# Patient Record
Sex: Male | Born: 1975 | Race: Black or African American | Hispanic: No | Marital: Single | Smoking: Current every day smoker
Health system: Southern US, Community
[De-identification: ages and names within clinical notes are randomized; demographics above are authoritative.]

## PROBLEM LIST (undated history)

## (undated) DIAGNOSIS — E119 Type 2 diabetes mellitus without complications: Secondary | ICD-10-CM

## (undated) DIAGNOSIS — Z87898 Personal history of other specified conditions: Secondary | ICD-10-CM

## (undated) DIAGNOSIS — F1591 Other stimulant use, unspecified, in remission: Secondary | ICD-10-CM

## (undated) DIAGNOSIS — F419 Anxiety disorder, unspecified: Secondary | ICD-10-CM

## (undated) DIAGNOSIS — F329 Major depressive disorder, single episode, unspecified: Secondary | ICD-10-CM

## (undated) DIAGNOSIS — F429 Obsessive-compulsive disorder, unspecified: Secondary | ICD-10-CM

## (undated) DIAGNOSIS — F32A Depression, unspecified: Secondary | ICD-10-CM

## (undated) DIAGNOSIS — I10 Essential (primary) hypertension: Secondary | ICD-10-CM

## (undated) HISTORY — PX: WISDOM TOOTH EXTRACTION: SHX21

## (undated) HISTORY — PX: TONSILLECTOMY: SUR1361

---

## 1998-09-06 ENCOUNTER — Emergency Department (HOSPITAL_COMMUNITY): Admission: EM | Admit: 1998-09-06 | Discharge: 1998-09-07 | Payer: Self-pay

## 2003-02-28 ENCOUNTER — Emergency Department (HOSPITAL_COMMUNITY): Admission: EM | Admit: 2003-02-28 | Discharge: 2003-02-28 | Payer: Self-pay | Admitting: Emergency Medicine

## 2006-11-01 ENCOUNTER — Emergency Department (HOSPITAL_COMMUNITY): Admission: EM | Admit: 2006-11-01 | Discharge: 2006-11-01 | Payer: Self-pay | Admitting: Emergency Medicine

## 2009-08-17 ENCOUNTER — Emergency Department (HOSPITAL_BASED_OUTPATIENT_CLINIC_OR_DEPARTMENT_OTHER): Admission: EM | Admit: 2009-08-17 | Discharge: 2009-08-17 | Payer: Self-pay | Admitting: Emergency Medicine

## 2010-01-01 ENCOUNTER — Ambulatory Visit: Payer: Self-pay | Admitting: Interventional Radiology

## 2010-01-01 ENCOUNTER — Emergency Department (HOSPITAL_BASED_OUTPATIENT_CLINIC_OR_DEPARTMENT_OTHER): Admission: EM | Admit: 2010-01-01 | Discharge: 2010-01-01 | Payer: Self-pay | Admitting: Emergency Medicine

## 2010-03-18 ENCOUNTER — Ambulatory Visit: Payer: Self-pay | Admitting: Diagnostic Radiology

## 2010-03-18 ENCOUNTER — Emergency Department (HOSPITAL_BASED_OUTPATIENT_CLINIC_OR_DEPARTMENT_OTHER): Admission: EM | Admit: 2010-03-18 | Discharge: 2010-03-18 | Payer: Self-pay | Admitting: Emergency Medicine

## 2010-04-05 ENCOUNTER — Ambulatory Visit: Payer: Self-pay | Admitting: Diagnostic Radiology

## 2010-04-05 ENCOUNTER — Emergency Department (HOSPITAL_BASED_OUTPATIENT_CLINIC_OR_DEPARTMENT_OTHER): Admission: EM | Admit: 2010-04-05 | Discharge: 2010-04-05 | Payer: Self-pay | Admitting: Emergency Medicine

## 2010-04-21 ENCOUNTER — Emergency Department (HOSPITAL_BASED_OUTPATIENT_CLINIC_OR_DEPARTMENT_OTHER): Admission: EM | Admit: 2010-04-21 | Discharge: 2010-04-21 | Payer: Self-pay | Admitting: Emergency Medicine

## 2010-04-21 ENCOUNTER — Ambulatory Visit: Payer: Self-pay | Admitting: Diagnostic Radiology

## 2010-05-10 ENCOUNTER — Emergency Department (HOSPITAL_BASED_OUTPATIENT_CLINIC_OR_DEPARTMENT_OTHER): Admission: EM | Admit: 2010-05-10 | Discharge: 2010-05-10 | Payer: Self-pay | Admitting: Emergency Medicine

## 2010-06-04 ENCOUNTER — Emergency Department (HOSPITAL_BASED_OUTPATIENT_CLINIC_OR_DEPARTMENT_OTHER): Admission: EM | Admit: 2010-06-04 | Discharge: 2010-06-04 | Payer: Self-pay | Admitting: Emergency Medicine

## 2010-06-05 ENCOUNTER — Emergency Department (HOSPITAL_BASED_OUTPATIENT_CLINIC_OR_DEPARTMENT_OTHER): Admission: EM | Admit: 2010-06-05 | Discharge: 2010-06-05 | Payer: Self-pay | Admitting: Emergency Medicine

## 2010-07-07 ENCOUNTER — Emergency Department (HOSPITAL_BASED_OUTPATIENT_CLINIC_OR_DEPARTMENT_OTHER): Admission: EM | Admit: 2010-07-07 | Discharge: 2010-07-07 | Payer: Self-pay | Admitting: Emergency Medicine

## 2010-07-07 ENCOUNTER — Ambulatory Visit: Payer: Self-pay | Admitting: Diagnostic Radiology

## 2010-08-12 ENCOUNTER — Emergency Department (HOSPITAL_BASED_OUTPATIENT_CLINIC_OR_DEPARTMENT_OTHER)
Admission: EM | Admit: 2010-08-12 | Discharge: 2010-08-12 | Payer: Self-pay | Source: Home / Self Care | Admitting: Emergency Medicine

## 2010-08-12 ENCOUNTER — Ambulatory Visit: Payer: Self-pay | Admitting: Diagnostic Radiology

## 2010-09-07 ENCOUNTER — Emergency Department (HOSPITAL_BASED_OUTPATIENT_CLINIC_OR_DEPARTMENT_OTHER)
Admission: EM | Admit: 2010-09-07 | Discharge: 2010-09-08 | Payer: Self-pay | Source: Home / Self Care | Admitting: Emergency Medicine

## 2010-09-08 ENCOUNTER — Ambulatory Visit: Payer: Self-pay | Admitting: Diagnostic Radiology

## 2010-11-04 ENCOUNTER — Emergency Department (HOSPITAL_BASED_OUTPATIENT_CLINIC_OR_DEPARTMENT_OTHER)
Admission: EM | Admit: 2010-11-04 | Discharge: 2010-11-04 | Payer: Self-pay | Source: Home / Self Care | Admitting: Emergency Medicine

## 2010-12-14 ENCOUNTER — Emergency Department (HOSPITAL_BASED_OUTPATIENT_CLINIC_OR_DEPARTMENT_OTHER)
Admission: EM | Admit: 2010-12-14 | Discharge: 2010-12-14 | Payer: Self-pay | Source: Home / Self Care | Admitting: Emergency Medicine

## 2010-12-15 LAB — CBC
Platelets: 242 10*3/uL (ref 150–400)
RBC: 5.32 MIL/uL (ref 4.22–5.81)
WBC: 10.3 10*3/uL (ref 4.0–10.5)

## 2010-12-15 LAB — URINALYSIS, ROUTINE W REFLEX MICROSCOPIC
Nitrite: NEGATIVE
Protein, ur: NEGATIVE mg/dL
Urobilinogen, UA: 0.2 mg/dL (ref 0.0–1.0)

## 2010-12-15 LAB — DIFFERENTIAL
Basophils Relative: 0 % (ref 0–1)
Eosinophils Absolute: 0.3 10*3/uL (ref 0.0–0.7)
Neutrophils Relative %: 57 % (ref 43–77)

## 2010-12-15 LAB — COMPREHENSIVE METABOLIC PANEL
AST: 19 U/L (ref 0–37)
Albumin: 4.1 g/dL (ref 3.5–5.2)
Alkaline Phosphatase: 116 U/L (ref 39–117)
Chloride: 106 mEq/L (ref 96–112)
Creatinine, Ser: 0.8 mg/dL (ref 0.4–1.5)
GFR calc Af Amer: >60 mL/min (ref >60)
Potassium: 4.3 mEq/L (ref 3.5–5.1)
Total Bilirubin: 0.5 mg/dL (ref 0.3–1.2)
Total Protein: 7.8 g/dL (ref 6.0–8.3)

## 2011-02-03 LAB — LIPASE, BLOOD: Lipase: 52 U/L (ref 23–300)

## 2011-02-03 LAB — URINALYSIS, ROUTINE W REFLEX MICROSCOPIC
Nitrite: NEGATIVE
Protein, ur: NEGATIVE mg/dL
Specific Gravity, Urine: 1.028 (ref 1.005–1.030)
Urobilinogen, UA: 1 mg/dL (ref 0.0–1.0)

## 2011-02-03 LAB — COMPREHENSIVE METABOLIC PANEL
Albumin: 4.1 g/dL (ref 3.5–5.2)
Alkaline Phosphatase: 117 U/L (ref 39–117)
BUN: 11 mg/dL (ref 6–23)
CO2: 27 mEq/L (ref 19–32)
Chloride: 107 mEq/L (ref 96–112)
Creatinine, Ser: 1 mg/dL (ref 0.4–1.5)
GFR calc non Af Amer: >60 mL/min (ref >60)
Glucose, Bld: 80 mg/dL (ref 70–99)
Total Bilirubin: 0.5 mg/dL (ref 0.3–1.2)

## 2011-02-03 LAB — CBC
HCT: 41.3 % (ref 39.0–52.0)
MCH: 28.6 pg (ref 26.0–34.0)
MCV: 84.7 fL (ref 78.0–100.0)
Platelets: 205 10*3/uL (ref 150–400)
RBC: 4.88 MIL/uL (ref 4.22–5.81)

## 2011-02-03 LAB — DIFFERENTIAL
Basophils Absolute: 0.2 10*3/uL — ABNORMAL HIGH (ref 0.0–0.1)
Basophils Relative: 2 % — ABNORMAL HIGH (ref 0–1)
Lymphocytes Relative: 31 % (ref 12–46)
Neutro Abs: 4 10*3/uL (ref 1.7–7.7)
Neutrophils Relative %: 55 % (ref 43–77)

## 2011-02-04 LAB — BASIC METABOLIC PANEL
Calcium: 9.1 mg/dL (ref 8.4–10.5)
Creatinine, Ser: 0.9 mg/dL (ref 0.4–1.5)
GFR calc Af Amer: >60 mL/min (ref >60)
GFR calc non Af Amer: >60 mL/min (ref >60)
Sodium: 142 mEq/L (ref 135–145)

## 2011-02-04 LAB — DIFFERENTIAL
Basophils Relative: 2 % — ABNORMAL HIGH (ref 0–1)
Eosinophils Absolute: 0.4 10*3/uL (ref 0.0–0.7)
Lymphs Abs: 2.7 10*3/uL (ref 0.7–4.0)
Monocytes Relative: 11 % (ref 3–12)
Neutro Abs: 2.6 10*3/uL (ref 1.7–7.7)
Neutrophils Relative %: 40 % — ABNORMAL LOW (ref 43–77)

## 2011-02-04 LAB — CBC
Hemoglobin: 14.3 g/dL (ref 13.0–17.0)
MCH: 28.3 pg (ref 26.0–34.0)
MCHC: 32.6 g/dL (ref 30.0–36.0)
Platelets: 237 10*3/uL (ref 150–400)
RBC: 5.06 MIL/uL (ref 4.22–5.81)

## 2011-02-04 LAB — POCT CARDIAC MARKERS: Myoglobin, poc: 51.1 ng/mL (ref 12–200)

## 2011-02-04 LAB — D-DIMER, QUANTITATIVE: D-Dimer, Quant: 0.39 ug/mL-FEU (ref 0.00–0.48)

## 2011-02-05 LAB — BASIC METABOLIC PANEL WITH GFR
GFR calc non Af Amer: >60 mL/min (ref >60)
Glucose, Bld: 102 mg/dL — ABNORMAL HIGH (ref 70–99)
Potassium: 4.4 meq/L (ref 3.5–5.1)
Sodium: 143 meq/L (ref 135–145)

## 2011-02-05 LAB — BASIC METABOLIC PANEL
BUN: 12 mg/dL (ref 6–23)
CO2: 29 mEq/L (ref 19–32)
Calcium: 9.1 mg/dL (ref 8.4–10.5)
Chloride: 104 mEq/L (ref 96–112)
Creatinine, Ser: 1.1 mg/dL (ref 0.4–1.5)
GFR calc Af Amer: >60 mL/min (ref >60)

## 2011-02-06 LAB — URINALYSIS, ROUTINE W REFLEX MICROSCOPIC
Bilirubin Urine: NEGATIVE
Hgb urine dipstick: NEGATIVE
Nitrite: NEGATIVE
Specific Gravity, Urine: 1.024 (ref 1.005–1.030)
pH: 6 (ref 5.0–8.0)

## 2011-02-06 LAB — CBC
MCHC: 32.9 g/dL (ref 30.0–36.0)
MCV: 85 fL (ref 78.0–100.0)
Platelets: 262 10*3/uL (ref 150–400)
WBC: 6.7 10*3/uL (ref 4.0–10.5)

## 2011-02-06 LAB — DIFFERENTIAL
Eosinophils Relative: 4 % (ref 0–5)
Lymphocytes Relative: 26 % (ref 12–46)
Lymphs Abs: 1.7 10*3/uL (ref 0.7–4.0)
Monocytes Absolute: 0.5 10*3/uL (ref 0.1–1.0)
Monocytes Relative: 8 % (ref 3–12)

## 2011-02-06 LAB — COMPREHENSIVE METABOLIC PANEL
AST: 18 U/L (ref 0–37)
Albumin: 4 g/dL (ref 3.5–5.2)
Calcium: 9.1 mg/dL (ref 8.4–10.5)
Creatinine, Ser: 0.9 mg/dL (ref 0.4–1.5)
GFR calc Af Amer: >60 mL/min (ref >60)

## 2011-02-07 LAB — URINALYSIS, ROUTINE W REFLEX MICROSCOPIC
Glucose, UA: NEGATIVE mg/dL
Ketones, ur: NEGATIVE mg/dL
Nitrite: NEGATIVE
Nitrite: NEGATIVE
Protein, ur: NEGATIVE mg/dL
Urobilinogen, UA: 0.2 mg/dL (ref 0.0–1.0)
pH: 6.5 (ref 5.0–8.0)
pH: 6 (ref 5.0–8.0)

## 2011-02-07 LAB — DIFFERENTIAL
Basophils Relative: 3 % — ABNORMAL HIGH (ref 0–1)
Basophils Relative: 2 % — ABNORMAL HIGH (ref 0–1)
Lymphocytes Relative: 28 % (ref 12–46)
Lymphs Abs: 2.3 10*3/uL (ref 0.7–4.0)
Monocytes Absolute: 0.5 10*3/uL (ref 0.1–1.0)
Monocytes Relative: 9 % (ref 3–12)
Monocytes Relative: 11 % (ref 3–12)
Neutro Abs: 3.6 10*3/uL (ref 1.7–7.7)
Neutro Abs: 4.1 10*3/uL (ref 1.7–7.7)
Neutrophils Relative %: 53 % (ref 43–77)

## 2011-02-07 LAB — CBC
Hemoglobin: 14.2 g/dL (ref 13.0–17.0)
MCHC: 32 g/dL (ref 30.0–36.0)
MCHC: 33.3 g/dL (ref 30.0–36.0)
MCV: 84 fL (ref 78.0–100.0)
RBC: 5.27 MIL/uL (ref 4.22–5.81)
RBC: 5.61 MIL/uL (ref 4.22–5.81)
WBC: 8 10*3/uL (ref 4.0–10.5)

## 2011-02-07 LAB — BASIC METABOLIC PANEL
Calcium: 9.5 mg/dL (ref 8.4–10.5)
Creatinine, Ser: 0.9 mg/dL (ref 0.4–1.5)
GFR calc Af Amer: >60 mL/min (ref >60)
GFR calc non Af Amer: >60 mL/min (ref >60)
Sodium: 142 mEq/L (ref 135–145)

## 2011-02-07 LAB — POCT I-STAT, CHEM 8
Calcium, Ion: 1.14 mmol/L (ref 1.12–1.32)
Chloride: 105 mEq/L (ref 96–112)
Creatinine, Ser: 0.9 mg/dL (ref 0.4–1.5)
Glucose, Bld: 126 mg/dL — ABNORMAL HIGH (ref 70–99)
HCT: 46 % (ref 39.0–52.0)

## 2011-02-07 LAB — RAPID STREP SCREEN (MED CTR MEBANE ONLY): Streptococcus, Group A Screen (Direct): NEGATIVE

## 2011-02-08 LAB — BASIC METABOLIC PANEL
BUN: 9 mg/dL (ref 6–23)
Creatinine, Ser: 0.8 mg/dL (ref 0.4–1.5)
GFR calc Af Amer: >60 mL/min (ref >60)
GFR calc non Af Amer: >60 mL/min (ref >60)
Potassium: 4 mEq/L (ref 3.5–5.1)

## 2011-02-08 LAB — POCT CARDIAC MARKERS
Myoglobin, poc: 50.1 ng/mL (ref 12–200)
Troponin i, poc: <0.05 ng/mL (ref 0.00–0.09)

## 2011-02-08 LAB — DIFFERENTIAL
Lymphocytes Relative: 36 % (ref 12–46)
Lymphs Abs: 2.9 10*3/uL (ref 0.7–4.0)
Neutrophils Relative %: 47 % (ref 43–77)

## 2011-02-08 LAB — CBC
HCT: 42.9 % (ref 39.0–52.0)
Platelets: 244 10*3/uL (ref 150–400)
WBC: 8.1 10*3/uL (ref 4.0–10.5)

## 2011-02-09 LAB — POCT CARDIAC MARKERS
Troponin i, poc: <0.05 ng/mL (ref 0.00–0.09)
Troponin i, poc: <0.05 ng/mL (ref 0.00–0.09)

## 2011-02-09 LAB — BASIC METABOLIC PANEL
CO2: 29 mEq/L (ref 19–32)
Calcium: 9.2 mg/dL (ref 8.4–10.5)
Creatinine, Ser: 0.8 mg/dL (ref 0.4–1.5)
Glucose, Bld: 96 mg/dL (ref 70–99)

## 2011-02-09 LAB — GLUCOSE, CAPILLARY: Glucose-Capillary: 110 mg/dL — ABNORMAL HIGH (ref 70–99)

## 2011-02-09 LAB — URINALYSIS, ROUTINE W REFLEX MICROSCOPIC
Glucose, UA: NEGATIVE mg/dL
Hgb urine dipstick: NEGATIVE
Ketones, ur: NEGATIVE mg/dL
Protein, ur: NEGATIVE mg/dL

## 2011-02-22 ENCOUNTER — Emergency Department (HOSPITAL_BASED_OUTPATIENT_CLINIC_OR_DEPARTMENT_OTHER)
Admission: EM | Admit: 2011-02-22 | Discharge: 2011-02-22 | Disposition: A | Discharge status: Home / Self Care | Payer: 59 | Attending: Emergency Medicine | Admitting: Emergency Medicine

## 2011-02-22 ENCOUNTER — Emergency Department (INDEPENDENT_AMBULATORY_CARE_PROVIDER_SITE_OTHER): Payer: 59

## 2011-02-22 DIAGNOSIS — R071 Chest pain on breathing: Secondary | ICD-10-CM

## 2011-02-22 DIAGNOSIS — I10 Essential (primary) hypertension: Secondary | ICD-10-CM | POA: Insufficient documentation

## 2011-02-22 DIAGNOSIS — R0789 Other chest pain: Secondary | ICD-10-CM | POA: Insufficient documentation

## 2011-02-22 DIAGNOSIS — K219 Gastro-esophageal reflux disease without esophagitis: Secondary | ICD-10-CM | POA: Insufficient documentation

## 2011-02-22 DIAGNOSIS — M549 Dorsalgia, unspecified: Secondary | ICD-10-CM

## 2011-02-22 DIAGNOSIS — R51 Headache: Secondary | ICD-10-CM | POA: Insufficient documentation

## 2011-02-22 DIAGNOSIS — Z87891 Personal history of nicotine dependence: Secondary | ICD-10-CM

## 2011-02-22 DIAGNOSIS — J329 Chronic sinusitis, unspecified: Secondary | ICD-10-CM | POA: Insufficient documentation

## 2011-02-22 DIAGNOSIS — Z79899 Other long term (current) drug therapy: Secondary | ICD-10-CM | POA: Insufficient documentation

## 2011-02-22 LAB — CBC
HCT: 43.6 % (ref 39.0–52.0)
Hemoglobin: 14.7 g/dL (ref 13.0–17.0)
MCH: 27.9 pg (ref 26.0–34.0)
MCHC: 33.7 g/dL (ref 30.0–36.0)
RDW: 14 % (ref 11.5–15.5)

## 2011-02-22 LAB — BASIC METABOLIC PANEL
CO2: 24 mEq/L (ref 19–32)
Calcium: 9.3 mg/dL (ref 8.4–10.5)
Creatinine, Ser: 0.8 mg/dL (ref 0.4–1.5)
GFR calc Af Amer: >60 mL/min (ref >60)
GFR calc non Af Amer: >60 mL/min (ref >60)
Glucose, Bld: 105 mg/dL — ABNORMAL HIGH (ref 70–99)

## 2011-02-22 LAB — DIFFERENTIAL
Basophils Absolute: 0 10*3/uL (ref 0.0–0.1)
Basophils Relative: 1 % (ref 0–1)
Eosinophils Relative: 8 % — ABNORMAL HIGH (ref 0–5)
Monocytes Absolute: 0.7 10*3/uL (ref 0.1–1.0)
Monocytes Relative: 13 % — ABNORMAL HIGH (ref 3–12)
Neutro Abs: 2.2 10*3/uL (ref 1.7–7.7)

## 2011-02-25 LAB — BASIC METABOLIC PANEL
BUN: 11 mg/dL (ref 6–23)
CO2: 23 mEq/L (ref 19–32)
Chloride: 104 mEq/L (ref 96–112)
Glucose, Bld: 118 mg/dL — ABNORMAL HIGH (ref 70–99)
Potassium: 3.6 mEq/L (ref 3.5–5.1)
Sodium: 141 mEq/L (ref 135–145)

## 2011-02-25 LAB — CBC
HCT: 50.1 % (ref 39.0–52.0)
Hemoglobin: 16.6 g/dL (ref 13.0–17.0)
MCHC: 33.1 g/dL (ref 30.0–36.0)
MCV: 84.7 fL (ref 78.0–100.0)
Platelets: 295 10*3/uL (ref 150–400)
RDW: 13.1 % (ref 11.5–15.5)

## 2011-03-17 ENCOUNTER — Emergency Department (HOSPITAL_BASED_OUTPATIENT_CLINIC_OR_DEPARTMENT_OTHER)
Admission: EM | Admit: 2011-03-17 | Discharge: 2011-03-17 | Disposition: A | Discharge status: Home / Self Care | Payer: 59 | Attending: Emergency Medicine | Admitting: Emergency Medicine

## 2011-03-17 DIAGNOSIS — F17200 Nicotine dependence, unspecified, uncomplicated: Secondary | ICD-10-CM | POA: Insufficient documentation

## 2011-03-17 DIAGNOSIS — I10 Essential (primary) hypertension: Secondary | ICD-10-CM | POA: Insufficient documentation

## 2011-03-17 DIAGNOSIS — R197 Diarrhea, unspecified: Secondary | ICD-10-CM | POA: Insufficient documentation

## 2011-03-17 DIAGNOSIS — R110 Nausea: Secondary | ICD-10-CM | POA: Insufficient documentation

## 2011-03-17 DIAGNOSIS — K219 Gastro-esophageal reflux disease without esophagitis: Secondary | ICD-10-CM | POA: Insufficient documentation

## 2011-03-17 LAB — URINALYSIS, ROUTINE W REFLEX MICROSCOPIC
Bilirubin Urine: NEGATIVE
Hgb urine dipstick: NEGATIVE
Ketones, ur: NEGATIVE mg/dL
Protein, ur: NEGATIVE mg/dL
Specific Gravity, Urine: 1.019 (ref 1.005–1.030)
Urobilinogen, UA: 0.2 mg/dL (ref 0.0–1.0)

## 2011-03-17 LAB — BASIC METABOLIC PANEL
BUN: 10 mg/dL (ref 6–23)
CO2: 28 mEq/L (ref 19–32)
Calcium: 9.3 mg/dL (ref 8.4–10.5)
Glucose, Bld: 88 mg/dL (ref 70–99)
Sodium: 143 mEq/L (ref 135–145)

## 2011-10-08 ENCOUNTER — Emergency Department (HOSPITAL_BASED_OUTPATIENT_CLINIC_OR_DEPARTMENT_OTHER)
Admission: EM | Admit: 2011-10-08 | Discharge: 2011-10-08 | Disposition: A | Discharge status: Home / Self Care | Payer: 59 | Attending: Emergency Medicine | Admitting: Emergency Medicine

## 2011-10-08 ENCOUNTER — Encounter: Payer: Self-pay | Admitting: *Deleted

## 2011-10-08 DIAGNOSIS — K5900 Constipation, unspecified: Secondary | ICD-10-CM | POA: Insufficient documentation

## 2011-10-08 DIAGNOSIS — K644 Residual hemorrhoidal skin tags: Secondary | ICD-10-CM | POA: Insufficient documentation

## 2011-10-08 DIAGNOSIS — I10 Essential (primary) hypertension: Secondary | ICD-10-CM | POA: Insufficient documentation

## 2011-10-08 HISTORY — DX: Essential (primary) hypertension: I10

## 2011-10-08 MED ORDER — HYDROCORTISONE 2.5 % RE CREA: TOPICAL_CREAM | RECTAL | Status: AC

## 2011-10-08 NOTE — ED Notes (Signed)
Pt states he noticed 2 swollen areas around his rectum this a.m. +itching No BM in 3 days

## 2011-10-08 NOTE — ED Provider Notes (Signed)
Medical screening examination/treatment/procedure(s) were performed by non-physician practitioner and as supervising physician I was immediately available for consultation/collaboration.    Celene Kras, MD 10/08/11 [phone]

## 2011-10-08 NOTE — ED Provider Notes (Signed)
History     CSN: [id number] Arrival date & time: 10/08/2011  2:06 PM   First MD Initiated Contact with Patient 10/08/11 1415      Chief Complaint  Patient presents with  . Hemorrhoids    (Consider location/radiation/quality/duration/timing/severity/associated sxs/prior treatment) Patient is a 35 y.o. male presenting with constipation. The history is provided by the patient.  Constipation  The current episode started 3 to 5 days ago. The problem has been unchanged. Pain severity now: He complains of generalized, mild episodic abdominal pain associated with constipation. He also complains of rectal pain and swelling without bleeding. The stool is described as hard. Associated symptoms include abdominal pain, hemorrhoids and rectal pain. Pertinent negatives include no fever.    Past Medical History  Diagnosis Date  . Hypertension     Past Surgical History  Procedure Date  . Wisdom tooth extraction   . Tonsillectomy     History reviewed. No pertinent family history.  History  Substance Use Topics  . Smoking status: Never Smoker   . Smokeless tobacco: Not on file  . Alcohol Use: No      Review of Systems  Constitutional: Negative for fever and chills.  Gastrointestinal: Positive for abdominal pain, constipation, rectal pain and hemorrhoids.       See HPI.  Musculoskeletal: Negative.   Skin: Negative.   Neurological: Negative.     Allergies  Penicillins  Home Medications   Current Outpatient Rx  Name Route Sig Dispense Refill  . ALPRAZOLAM ER 0.5 MG PO TB24 Oral Take 0.5 mg by mouth 1 day or 1 dose.      . IMIPRAMINE HCL 10 MG PO TABS Oral Take 20 mg by mouth at bedtime.      . NEBIVOLOL HCL 10 MG PO TABS Oral Take 10 mg by mouth daily.        BP 137/83  Pulse 84  Temp(Src) 98.4 F (36.9 C) (Oral)  Resp 20  Ht 5\' 11"  (1.803 m)  Wt 255 lb (115.667 kg)  BMI 35.57 kg/m2  SpO2 99%  Physical Exam  Constitutional: He is oriented to person, place, and  time. He appears well-developed and well-nourished.  Neck: Normal range of motion.  Pulmonary/Chest: Effort normal.  Abdominal: Soft. There is no tenderness. There is no rebound and no guarding.  Genitourinary: Rectal exam shows external hemorrhoid and tenderness. Rectal exam shows no fissure.  Musculoskeletal: Normal range of motion.  Neurological: He is alert and oriented to person, place, and time.  Skin: Skin is warm and dry.  Psychiatric: He has a normal mood and affect.    ED Course  Procedures (including critical care time)  Labs Reviewed - No data to display No results found.   No diagnosis found.    MDM          Rodena Medin, PA 10/08/11 1501

## 2012-04-09 ENCOUNTER — Encounter (HOSPITAL_BASED_OUTPATIENT_CLINIC_OR_DEPARTMENT_OTHER): Payer: Self-pay | Admitting: *Deleted

## 2012-04-09 ENCOUNTER — Emergency Department (HOSPITAL_BASED_OUTPATIENT_CLINIC_OR_DEPARTMENT_OTHER)
Admission: EM | Admit: 2012-04-09 | Discharge: 2012-04-09 | Disposition: A | Discharge status: Home / Self Care | Payer: Self-pay | Attending: Emergency Medicine | Admitting: Emergency Medicine

## 2012-04-09 DIAGNOSIS — R509 Fever, unspecified: Secondary | ICD-10-CM | POA: Insufficient documentation

## 2012-04-09 DIAGNOSIS — R059 Cough, unspecified: Secondary | ICD-10-CM | POA: Insufficient documentation

## 2012-04-09 DIAGNOSIS — J019 Acute sinusitis, unspecified: Secondary | ICD-10-CM | POA: Insufficient documentation

## 2012-04-09 DIAGNOSIS — R51 Headache: Secondary | ICD-10-CM | POA: Insufficient documentation

## 2012-04-09 DIAGNOSIS — R05 Cough: Secondary | ICD-10-CM | POA: Insufficient documentation

## 2012-04-09 HISTORY — DX: Anxiety disorder, unspecified: F41.9

## 2012-04-09 MED ORDER — CLINDAMYCIN HCL 150 MG PO CAPS: 300.0000 mg | ORAL_CAPSULE | Freq: Three times a day (TID) | ORAL | Status: AC

## 2012-04-09 MED ORDER — PREDNISONE (PAK) 10 MG PO TABS: ORAL_TABLET | ORAL | Status: AC

## 2012-04-09 NOTE — Discharge Instructions (Signed)
How to clear your sinuses:  Use Afrin (and over-the-counter nasal decongestant spray): 1-2 squirts in each nostril. Wait 10 minutes (while using either warm compresses to face, a humidifier, or taking a hot shower) Then do 2 squirts of fluticasone/flonase (prescription)  in each nostril. Do this for 1-2 weeks and that should help significantly with sinus congestion.   Sinusitis Sinuses are air pockets within the bones of your face. The growth of bacteria within a sinus leads to infection. The infection prevents the sinuses from draining. This infection is called sinusitis. SYMPTOMS  There will be different areas of pain depending on which sinuses have become infected.  The maxillary sinuses often produce pain beneath the eyes.   Frontal sinusitis may cause pain in the middle of the forehead and above the eyes.  Other problems (symptoms) include:  Toothaches.   Colored, pus-like (purulent) drainage from the nose.   Swelling, warmth, and tenderness over the sinus areas may be signs of infection.  TREATMENT  Sinusitis is most often determined by an exam.X-rays may be taken. If x-rays have been taken, make sure you obtain your results or find out how you are to obtain them. Your caregiver may give you medications (antibiotics). These are medications that will help kill the bacteria causing the infection. You may also be given a medication (decongestant) that helps to reduce sinus swelling.  HOME CARE INSTRUCTIONS   Only take over-the-counter or prescription medicines for pain, discomfort, or fever as directed by your caregiver.   Drink extra fluids. Fluids help thin the mucus so your sinuses can drain more easily.   Applying either moist heat or ice packs to the sinus areas may help relieve discomfort.   Use saline nasal sprays to help moisten your sinuses. The sprays can be found at your local drugstore.  SEEK IMMEDIATE MEDICAL CARE IF:  You have a fever.   You have increasing pain,  severe headaches, or toothache.   You have nausea, vomiting, or drowsiness.   You develop unusual swelling around the face or trouble seeing.  MAKE SURE YOU:   Understand these instructions.   Will watch your condition.   Will get help right away if you are not doing well or get worse.  Document Released: 11/07/2005 Document Revised: 10/27/2011 Document Reviewed: 06/06/2007 [facility] Patient Information 2012 [place], [place].

## 2012-04-09 NOTE — ED Notes (Signed)
Pt c/o URI symptoms x 2 days, fever and sore throat with sinus congestion

## 2012-04-09 NOTE — ED Provider Notes (Signed)
History     CSN: [id number]  Arrival date & time 04/09/12  1525   First MD Initiated Contact with Patient 04/09/12 1545     4:16 PM Patient reports sinus infection for 2 weeks. Reports worsening symptoms in the past 2 days. Reports symptoms include nasal congestion, rhinorrhea, sinus pressure, sinus headache, fever, cough. Reports fever of 101 this morning relieved after taking 500 mg Tylenol. States he has had similar symptoms on 2 other occasions earlier this year. Reports he was given a Z-Pak. States this medication is ineffective for him. Denies chest pain, shortness of breath, neck pain, difficulty swallowing, abdominal pain, nausea, vomiting.  Patient is a 36 y.o. male presenting with URI.  URI The primary symptoms include fever, headaches, sore throat and cough. Primary symptoms do not include ear pain, wheezing, abdominal pain, nausea, vomiting, myalgias, arthralgias or rash. The current episode started more than 1 week ago. This is a new problem. The problem has not changed since onset. Symptoms associated with the illness include chills, facial pain, sinus pressure, congestion and rhinorrhea. The following treatments were addressed: Acetaminophen was ineffective. A decongestant was ineffective. NSAIDs were ineffective.    Past Medical History  Diagnosis Date  . Hypertension   . Anxiety     Past Surgical History  Procedure Date  . Wisdom tooth extraction   . Tonsillectomy     History reviewed. No pertinent family history.  History  Substance Use Topics  . Smoking status: Current Everyday Smoker -- 0.5 packs/day  . Smokeless tobacco: Not on file  . Alcohol Use: No      Review of Systems  Constitutional: Positive for fever and chills.  HENT: Positive for congestion, sore throat, rhinorrhea, postnasal drip and sinus pressure. Negative for ear pain.   Respiratory: Positive for cough. Negative for shortness of breath and wheezing.   Gastrointestinal: Negative for  nausea, vomiting and abdominal pain.  Musculoskeletal: Negative for myalgias and arthralgias.  Skin: Negative for rash.  Neurological: Positive for headaches.  All other systems reviewed and are negative.    Allergies  Penicillins  Home Medications   Current Outpatient Rx  Name Route Sig Dispense Refill  . ALPRAZOLAM ER 0.5 MG PO TB24 Oral Take 0.5 mg by mouth 1 day or 1 dose.      . IMIPRAMINE HCL 10 MG PO TABS Oral Take 20 mg by mouth at bedtime.      . NEBIVOLOL HCL 10 MG PO TABS Oral Take 10 mg by mouth daily.        BP 166/67  Pulse 95  Temp(Src) 99 F (37.2 C) (Oral)  Resp 18  Ht 5\' 11"  (1.803 m)  Wt 260 lb (117.935 kg)  BMI 36.26 kg/m2  SpO2 100%  Physical Exam  Constitutional: He is oriented to person, place, and time. He appears well-developed and well-nourished.  HENT:  Head: Normocephalic and atraumatic. No trismus in the jaw.  Right Ear: Tympanic membrane, external ear and ear canal normal.  Left Ear: Tympanic membrane, external ear and ear canal normal.  Nose: Mucosal edema and rhinorrhea present. No nose lacerations, sinus tenderness, nasal deformity, septal deviation or nasal septal hematoma. No epistaxis. Right sinus exhibits maxillary sinus tenderness. Right sinus exhibits no frontal sinus tenderness. Left sinus exhibits maxillary sinus tenderness. Left sinus exhibits no frontal sinus tenderness.  Mouth/Throat: Oropharynx is clear and moist and mucous membranes are normal. No uvula swelling. No oropharyngeal exudate, posterior oropharyngeal edema, posterior oropharyngeal erythema or tonsillar abscesses.  Eyes:  Conjunctivae are normal. Pupils are equal, round, and reactive to light.  Neck: Normal range of motion. Neck supple. No spinous process tenderness and no muscular tenderness present. No rigidity.  Cardiovascular: Normal rate, regular rhythm and normal heart sounds.  Exam reveals no gallop and no friction rub.   No murmur heard. Pulmonary/Chest: Effort  normal and breath sounds normal. He has no wheezes. He has no rales. He exhibits no tenderness.  Abdominal: Soft. Bowel sounds are normal.  Lymphadenopathy:    He has no cervical adenopathy.  Neurological: He is alert and oriented to person, place, and time.  Skin: Skin is warm and dry. No rash noted. No erythema. No pallor.  Psychiatric: He has a normal mood and affect. His behavior is normal.    ED Course  Procedures   MDM  Patient has green nasal drainage, fevers for greater and sinus induction for greater than 2 weeks will treat as a acute sinusitis with antibiotics and prednisone pack. Advised patient  however that the majority of sinusitis is viral and antibiotics are not doing symptoms he may just have to wait patiently for symptoms to improve. Will also give him instructions for clearing sinus drainage. Patient reports he can have a followup limit with [facility] weeks. Advised him to go ahead and schedule this. Patient voices understanding and is ready for discharge  Thomasene Lot, Cordelia Poche 04/09/12 1623

## 2012-04-12 NOTE — ED Provider Notes (Signed)
Medical screening examination/treatment/procedure(s) were performed by non-physician practitioner and as supervising physician I was immediately available for consultation/collaboration.    Nelia Shi, MD 04/12/12 [phone]

## 2013-01-09 ENCOUNTER — Encounter (HOSPITAL_BASED_OUTPATIENT_CLINIC_OR_DEPARTMENT_OTHER): Payer: Self-pay | Admitting: *Deleted

## 2013-01-09 DIAGNOSIS — R197 Diarrhea, unspecified: Secondary | ICD-10-CM | POA: Insufficient documentation

## 2013-01-09 DIAGNOSIS — K625 Hemorrhage of anus and rectum: Secondary | ICD-10-CM | POA: Insufficient documentation

## 2013-01-09 DIAGNOSIS — K648 Other hemorrhoids: Secondary | ICD-10-CM | POA: Insufficient documentation

## 2013-01-09 DIAGNOSIS — R110 Nausea: Secondary | ICD-10-CM | POA: Insufficient documentation

## 2013-01-09 DIAGNOSIS — K921 Melena: Secondary | ICD-10-CM | POA: Insufficient documentation

## 2013-01-09 DIAGNOSIS — I10 Essential (primary) hypertension: Secondary | ICD-10-CM | POA: Insufficient documentation

## 2013-01-09 DIAGNOSIS — F411 Generalized anxiety disorder: Secondary | ICD-10-CM | POA: Insufficient documentation

## 2013-01-09 DIAGNOSIS — F17200 Nicotine dependence, unspecified, uncomplicated: Secondary | ICD-10-CM | POA: Insufficient documentation

## 2013-01-09 DIAGNOSIS — Z79899 Other long term (current) drug therapy: Secondary | ICD-10-CM | POA: Insufficient documentation

## 2013-01-09 NOTE — ED Notes (Signed)
Pt c/o n/d with rectal bleeding x 1 day

## 2013-01-10 ENCOUNTER — Emergency Department (HOSPITAL_BASED_OUTPATIENT_CLINIC_OR_DEPARTMENT_OTHER)
Admission: EM | Admit: 2013-01-10 | Discharge: 2013-01-10 | Disposition: A | Discharge status: Home / Self Care | Payer: Self-pay | Attending: Emergency Medicine | Admitting: Emergency Medicine

## 2013-01-10 DIAGNOSIS — K921 Melena: Secondary | ICD-10-CM

## 2013-01-10 DIAGNOSIS — R110 Nausea: Secondary | ICD-10-CM

## 2013-01-10 DIAGNOSIS — R197 Diarrhea, unspecified: Secondary | ICD-10-CM

## 2013-01-10 DIAGNOSIS — K648 Other hemorrhoids: Secondary | ICD-10-CM

## 2013-01-10 LAB — COMPREHENSIVE METABOLIC PANEL
Albumin: 3.6 g/dL (ref 3.5–5.2)
BUN: 10 mg/dL (ref 6–23)
Calcium: 8.9 mg/dL (ref 8.4–10.5)
Chloride: 103 mEq/L (ref 96–112)
Creatinine, Ser: 1 mg/dL (ref 0.50–1.35)
GFR calc non Af Amer: >90 mL/min (ref >90)
Total Bilirubin: 0.3 mg/dL (ref 0.3–1.2)

## 2013-01-10 LAB — OCCULT BLOOD X 1 CARD TO LAB, STOOL: Fecal Occult Bld: NEGATIVE

## 2013-01-10 LAB — LIPASE, BLOOD: Lipase: 18 U/L (ref 11–59)

## 2013-01-10 MED ORDER — LOPERAMIDE HCL 2 MG PO CAPS
2.0000 mg | ORAL_CAPSULE | Freq: Four times a day (QID) | ORAL | Status: DC | PRN
Start: 2013-01-10 — End: 2013-03-04

## 2013-01-10 MED ORDER — ONDANSETRON 8 MG PO TBDP: 8.0000 mg | ORAL_TABLET | Freq: Once | ORAL | Status: DC

## 2013-01-10 MED ORDER — ONDANSETRON HCL 4 MG/2ML IJ SOLN
INTRAMUSCULAR | Status: AC
  Administered 2013-01-10: 8 mg
  Filled 2013-01-10: qty 4

## 2013-01-10 MED ORDER — LOPERAMIDE HCL 2 MG PO CAPS
4.0000 mg | ORAL_CAPSULE | Freq: Once | ORAL | Status: AC
  Administered 2013-01-10: 4 mg via ORAL
  Filled 2013-01-10: qty 2

## 2013-01-10 MED ORDER — KETOROLAC TROMETHAMINE 15 MG/ML IJ SOLN
15.0000 mg | Freq: Once | INTRAMUSCULAR | Status: AC
  Administered 2013-01-10: 15 mg via INTRAVENOUS
  Filled 2013-01-10: qty 1

## 2013-01-10 MED ORDER — SODIUM CHLORIDE 0.9 % IV BOLUS (SEPSIS)
1000.0000 mL | Freq: Once | INTRAVENOUS | Status: AC
  Administered 2013-01-10: 1000 mL via INTRAVENOUS

## 2013-01-10 MED ORDER — ONDANSETRON HCL 4 MG PO TABS: 4.0000 mg | ORAL_TABLET | Freq: Four times a day (QID) | ORAL | Status: DC

## 2013-01-10 MED ORDER — ONDANSETRON 8 MG/NS 50 ML IVPB
8.0000 mg | Freq: Once | INTRAVENOUS | Status: AC
  Filled 2013-01-10: qty 8

## 2013-01-10 NOTE — ED Provider Notes (Signed)
History     CSN: [id number]  Arrival date & time 01/09/13  2325   First MD Initiated Contact with Patient 01/10/13 0147      Chief Complaint  Patient presents with  . Rectal Bleeding  . Nausea  . Diarrhea    (Consider location/radiation/quality/duration/timing/severity/associated sxs/prior treatment) HPI Dean Hunt is a 37 y.o. male who presents with nausea and diarrhea. Patient's symptoms started about 5 days ago over the course of the last 3 days he's had 10-12 episodes of diarrhea daily associated with nausea. Patient had a fever of 102 yesterday which resolved with Tylenol and ibuprofen overnight. No fevers today. Patient's been using Zofran and Phenergan for nausea with limited success. Patient also says he's got abdominal pain at the top and the bottom of his abdomen, this is moderate to severe, feels like knots. Patient says he last ate out on Valentine's Day at [employer] and had a salad. No one else is sick at home. No recent travel. No chest pain, shortness of breath, rash.  Patient has had some myalgias with his fever. Patient says he seen some blood in the toilet today and some rectal bleeding with blood on the toilet paper. Patient says he's had a history of hemorrhoids in the past about ago.   Past Medical History  Diagnosis Date  . Hypertension   . Anxiety     Past Surgical History  Procedure Laterality Date  . Wisdom tooth extraction    . Tonsillectomy      History reviewed. No pertinent family history.  History  Substance Use Topics  . Smoking status: Current Every Day Smoker -- 0.50 packs/day    Types: Cigarettes  . Smokeless tobacco: Not on file  . Alcohol Use: No      Review of Systems At least 10pt or greater review of systems completed and are negative except where specified in the HPI.  Allergies  Compazine and Penicillins  Home Medications   Current Outpatient Rx  Name  Route  Sig  Dispense  Refill  . acetaminophen (TYLENOL) 500  MG tablet   Oral   Take 500 mg by mouth every 6 (six) hours as needed. Patient used this medication for his fever.         . ALPRAZolam (XANAX XR) 0.5 MG 24 hr tablet   Oral   Take 0.5 mg by mouth 1 day or 1 dose.           Marland Kitchen imipramine (TOFRANIL) 10 MG tablet   Oral   Take 20 mg by mouth at bedtime.           . nebivolol (BYSTOLIC) 10 MG tablet   Oral   Take 10 mg by mouth daily.             BP 128/88  Pulse 81  Temp(Src) 98.3 F (36.8 C) (Oral)  Resp 16  Ht 5\' 11"  (1.803 m)  Wt 255 lb (115.667 kg)  BMI 35.58 kg/m2  SpO2 100%  Physical Exam  Nursing notes reviewed.  Electronic medical record reviewed. VITAL SIGNS:   Filed Vitals:   01/09/13 2332 01/10/13 0339  BP: 128/88 121/56  Pulse: 81 66  Temp: 98.3 F (36.8 C) 97.9 F (36.6 C)  TempSrc: Oral Oral  Resp: 16 18  Height: 5\' 11"  (1.803 m)   Weight: 255 lb (115.667 kg)   SpO2: 100% 100%   CONSTITUTIONAL: Awake, oriented, appears non-toxic HENT: Atraumatic, normocephalic, oral mucosa pink and moist, airway patent.  Nares patent without drainage. External ears normal. EYES: Conjunctiva clear, EOMI, PERRLA NECK: Trachea midline, non-tender, supple CARDIOVASCULAR: Normal heart rate, Normal rhythm, No murmurs, rubs, gallops PULMONARY/CHEST: Clear to auscultation, no rhonchi, wheezes, or rales. Symmetrical breath sounds. Non-tender. ABDOMINAL: Non-distended, obese, soft, mildly tender to palpation in the epigastrium without rebound or guarding.  BS increased. Rectal: No gross blood, normal tone, small internal hemorrhoid palpated approximately 8:00 position NEUROLOGIC: Non-focal, moving all four extremities, no gross sensory or motor deficits. EXTREMITIES: No clubbing, cyanosis, or edema SKIN: Warm, Dry, No erythema, No rash  ED Course  Procedures (including critical care time)  Labs Reviewed  COMPREHENSIVE METABOLIC PANEL  LIPASE, BLOOD  OCCULT BLOOD X 1 CARD TO LAB, STOOL   No results  found.   1. Nausea   2. Diarrhea   3. Internal hemorrhoid   4. Hematochezia       MDM  Dean Hunt is a 37 y.o. male presenting with rectal bleeding, frequent diarrhea and nausea without vomiting. Patient is afebrile now but did have a fever yesterday. Patient's presentation is consistent with an enteritis, possible traveler's diarrhea, he did eat out at a salad the other night however has no known sick contacts. She has not been on antibiotics either. On physical exam the patient does have a small internal hemorrhoid this is likely the source of bleeding. Patient denies any dizziness or lightheadedness, no shortness of breath and does not appear anemic.  Obtained abdominal labs show normal lipase, normal LFTs - so I think the epigastric discomfort is secondary to his nausea.  Patient treated with Toradol, Zofran and 1 L of fluid, patient is feeling much better, also give the patient some loperamide for his diarrhea.  No evidence for acute intra-abdominal process at this time, no evidence for perforated viscus, peptic ulcer disease,  Gallbladder disease, liver disease or pancreatitis.  We'll send the patient home with a prescription for loperamide as well as Zofran.  I explained the diagnosis and have given explicit precautions to return to the ER including any other new or worsening symptoms. The patient understands and accepts the medical plan as it's been dictated and I have answered their questions. Discharge instructions concerning home care and prescriptions have been given.  The patient is STABLE and is discharged to home in good condition.          Dean Skene, MD 01/10/13 1610

## 2013-01-10 NOTE — ED Notes (Signed)
Pt reports nausea and diarrhea since Monday- has been using phenergan and zofran- temp 102 yesterday- reports he noticed blood in toilet bowl with last BM around 7pm

## 2013-01-10 NOTE — ED Notes (Signed)
MD at bedside. 

## 2013-01-12 ENCOUNTER — Encounter (HOSPITAL_BASED_OUTPATIENT_CLINIC_OR_DEPARTMENT_OTHER): Payer: Self-pay | Admitting: *Deleted

## 2013-01-12 ENCOUNTER — Emergency Department (HOSPITAL_BASED_OUTPATIENT_CLINIC_OR_DEPARTMENT_OTHER)
Admission: EM | Admit: 2013-01-12 | Discharge: 2013-01-12 | Disposition: A | Discharge status: Home / Self Care | Payer: Self-pay | Attending: Emergency Medicine | Admitting: Emergency Medicine

## 2013-01-12 DIAGNOSIS — F17200 Nicotine dependence, unspecified, uncomplicated: Secondary | ICD-10-CM | POA: Insufficient documentation

## 2013-01-12 DIAGNOSIS — R1013 Epigastric pain: Secondary | ICD-10-CM | POA: Insufficient documentation

## 2013-01-12 DIAGNOSIS — Z79899 Other long term (current) drug therapy: Secondary | ICD-10-CM | POA: Insufficient documentation

## 2013-01-12 DIAGNOSIS — F411 Generalized anxiety disorder: Secondary | ICD-10-CM | POA: Insufficient documentation

## 2013-01-12 DIAGNOSIS — I10 Essential (primary) hypertension: Secondary | ICD-10-CM | POA: Insufficient documentation

## 2013-01-12 DIAGNOSIS — R109 Unspecified abdominal pain: Secondary | ICD-10-CM

## 2013-01-12 LAB — CBC WITH DIFFERENTIAL/PLATELET
Basophils Absolute: 0 10*3/uL (ref 0.0–0.1)
Basophils Relative: 1 % (ref 0–1)
Hemoglobin: 14.2 g/dL (ref 13.0–17.0)
MCHC: 33.2 g/dL (ref 30.0–36.0)
Neutro Abs: 2.1 10*3/uL (ref 1.7–7.7)
Neutrophils Relative %: 37 % — ABNORMAL LOW (ref 43–77)
RDW: 13.9 % (ref 11.5–15.5)

## 2013-01-12 LAB — COMPREHENSIVE METABOLIC PANEL
AST: 16 U/L (ref 0–37)
Albumin: 3.7 g/dL (ref 3.5–5.2)
Alkaline Phosphatase: 92 U/L (ref 39–117)
Chloride: 106 mEq/L (ref 96–112)
Potassium: 4.3 mEq/L (ref 3.5–5.1)
Sodium: 142 mEq/L (ref 135–145)
Total Bilirubin: 0.2 mg/dL — ABNORMAL LOW (ref 0.3–1.2)

## 2013-01-12 MED ORDER — GI COCKTAIL ~~LOC~~: 30.0000 mL | Freq: Two times a day (BID) | ORAL | Status: DC

## 2013-01-12 MED ORDER — PANTOPRAZOLE SODIUM 20 MG PO TBEC: 20.0000 mg | DELAYED_RELEASE_TABLET | Freq: Every day | ORAL | Status: DC

## 2013-01-12 MED ORDER — PANTOPRAZOLE SODIUM 40 MG PO TBEC
40.0000 mg | DELAYED_RELEASE_TABLET | Freq: Once | ORAL | Status: AC
  Administered 2013-01-12: 40 mg via ORAL
  Filled 2013-01-12: qty 1

## 2013-01-12 MED ORDER — GI COCKTAIL ~~LOC~~
30.0000 mL | Freq: Once | ORAL | Status: AC
  Administered 2013-01-12: 30 mL via ORAL
  Filled 2013-01-12: qty 30

## 2013-01-12 NOTE — ED Provider Notes (Signed)
History     CSN: [id number]  Arrival date & time 01/12/13  1324   First MD Initiated Contact with Patient 01/12/13 1400      Chief Complaint  Patient presents with  . Abdominal Pain    (Consider location/radiation/quality/duration/timing/severity/associated sxs/prior treatment) HPI Comments: Patient is a 37 year old male who presents with abdominal pain for the past 5 days. The pain is located in his epigastrium and does not radiate. The pain is described as burning and severe. The pain started gradually and progressively worsened since the onset. No alleviating/aggravating factors. The patient has tried nothing for symptoms without relief. Associated symptoms include nothing. Patient denies fever, headache, NVD, chest pain, dysuria, constipation.  Patient is a 37 y.o. male presenting with abdominal pain.  Abdominal Pain   Past Medical History  Diagnosis Date  . Hypertension   . Anxiety     Past Surgical History  Procedure Laterality Date  . Wisdom tooth extraction    . Tonsillectomy      History reviewed. No pertinent family history.  History  Substance Use Topics  . Smoking status: Current Every Day Smoker -- 0.50 packs/day    Types: Cigarettes  . Smokeless tobacco: Not on file  . Alcohol Use: No      Review of Systems  Gastrointestinal: Positive for abdominal pain.  All other systems reviewed and are negative.    Allergies  Compazine and Penicillins  Home Medications   Current Outpatient Rx  Name  Route  Sig  Dispense  Refill  . acetaminophen (TYLENOL) 500 MG tablet   Oral   Take 500 mg by mouth every 6 (six) hours as needed. Patient used this medication for his fever.         . ALPRAZolam (XANAX XR) 0.5 MG 24 hr tablet   Oral   Take 0.5 mg by mouth 1 day or 1 dose.           Marland Kitchen imipramine (TOFRANIL) 10 MG tablet   Oral   Take 20 mg by mouth at bedtime.           Marland Kitchen loperamide (IMODIUM) 2 MG capsule   Oral   Take 1 capsule (2 mg total) by  mouth 4 (four) times daily as needed for diarrhea or loose stools.   12 capsule   0   . nebivolol (BYSTOLIC) 10 MG tablet   Oral   Take 10 mg by mouth daily.           . ondansetron (ZOFRAN) 4 MG tablet   Oral   Take 1-2 tablets (4-8 mg total) by mouth every 6 (six) hours.   12 tablet   0     BP 145/96  Pulse 68  Temp(Src) 98.7 F (37.1 C) (Oral)  Resp 16  Ht 5\' 11"  (1.803 m)  Wt 255 lb (115.667 kg)  BMI 35.58 kg/m2  SpO2 100%  Physical Exam  Nursing note and vitals reviewed. Constitutional: He is oriented to person, place, and time. He appears well-developed and well-nourished. No distress.  HENT:  Head: Normocephalic and atraumatic.  Eyes: Conjunctivae and EOM are normal.  Neck: Normal range of motion.  Cardiovascular: Normal rate and regular rhythm.  Exam reveals no gallop and no friction rub.   No murmur heard. Pulmonary/Chest: Effort normal and breath sounds normal. He has no wheezes. He has no rales. He exhibits no tenderness.  Abdominal: Soft. He exhibits no distension. There is tenderness. There is no rebound and no guarding.  Epigastric tenderness to palpation. No peritoneal signs.  Musculoskeletal: Normal range of motion.  Neurological: He is alert and oriented to person, place, and time. Coordination normal.  Speech is goal-oriented. Moves limbs without ataxia.   Skin: Skin is warm and dry.  Psychiatric: He has a normal mood and affect. His behavior is normal.    ED Course  Procedures (including critical care time)  Labs Reviewed  CBC WITH DIFFERENTIAL - Abnormal; Notable for the following:    Neutrophils Relative 37 (*)    All other components within normal limits  COMPREHENSIVE METABOLIC PANEL - Abnormal; Notable for the following:    Glucose, Bld 104 (*)    Total Bilirubin 0.2 (*)    All other components within normal limits  LIPASE, BLOOD   No results found.   1. Abdominal pain       MDM  2:14 PM Labs pending. Patient will have gi  cocktail and protonix. Vitals stable and patient is afebrile.   3:40 PM Labs unremarkable. Patient reports relief with gi cocktail and protonix. I will discharge him with the same prescriptions. Vitals stable and patient is afebrile. No further evaluation needed at this time.       Emilia Beck, PA-C 01/12/13 2201

## 2013-01-12 NOTE — ED Notes (Signed)
Pt seen here Wed for same. Also thinks he may have a sinus infection. C/O palpitations and SHOB onset last p.m. None at present.

## 2013-01-13 NOTE — ED Provider Notes (Signed)
Medical screening examination/treatment/procedure(s) were performed by non-physician practitioner and as supervising physician I was immediately available for consultation/collaboration.  Courtney Bellizzi, MD 01/13/13 0707 

## 2013-02-25 ENCOUNTER — Encounter (HOSPITAL_BASED_OUTPATIENT_CLINIC_OR_DEPARTMENT_OTHER): Payer: Self-pay | Admitting: *Deleted

## 2013-02-25 ENCOUNTER — Emergency Department (HOSPITAL_BASED_OUTPATIENT_CLINIC_OR_DEPARTMENT_OTHER)
Admission: EM | Admit: 2013-02-25 | Discharge: 2013-02-26 | Disposition: A | Discharge status: Home / Self Care | Payer: Self-pay | Attending: Emergency Medicine | Admitting: Emergency Medicine

## 2013-02-25 ENCOUNTER — Other Ambulatory Visit: Payer: Self-pay

## 2013-02-25 ENCOUNTER — Emergency Department (HOSPITAL_BASED_OUTPATIENT_CLINIC_OR_DEPARTMENT_OTHER): Payer: Self-pay

## 2013-02-25 DIAGNOSIS — R0602 Shortness of breath: Secondary | ICD-10-CM | POA: Insufficient documentation

## 2013-02-25 DIAGNOSIS — M25519 Pain in unspecified shoulder: Secondary | ICD-10-CM | POA: Insufficient documentation

## 2013-02-25 DIAGNOSIS — Z79899 Other long term (current) drug therapy: Secondary | ICD-10-CM | POA: Insufficient documentation

## 2013-02-25 DIAGNOSIS — R0789 Other chest pain: Secondary | ICD-10-CM | POA: Insufficient documentation

## 2013-02-25 DIAGNOSIS — F411 Generalized anxiety disorder: Secondary | ICD-10-CM | POA: Insufficient documentation

## 2013-02-25 DIAGNOSIS — I10 Essential (primary) hypertension: Secondary | ICD-10-CM | POA: Insufficient documentation

## 2013-02-25 DIAGNOSIS — F17200 Nicotine dependence, unspecified, uncomplicated: Secondary | ICD-10-CM | POA: Insufficient documentation

## 2013-02-25 LAB — CBC WITH DIFFERENTIAL/PLATELET
Basophils Relative: 1 % (ref 0–1)
Eosinophils Absolute: 0.4 10*3/uL (ref 0.0–0.7)
Eosinophils Relative: 5 % (ref 0–5)
HCT: 43.3 % (ref 39.0–52.0)
Hemoglobin: 14.6 g/dL (ref 13.0–17.0)
Lymphs Abs: 3.3 10*3/uL (ref 0.7–4.0)
MCH: 28.5 pg (ref 26.0–34.0)
MCHC: 33.7 g/dL (ref 30.0–36.0)
MCV: 84.6 fL (ref 78.0–100.0)
Monocytes Absolute: 0.7 10*3/uL (ref 0.1–1.0)
Monocytes Relative: 9 % (ref 3–12)
Neutrophils Relative %: 37 % — ABNORMAL LOW (ref 43–77)
RBC: 5.12 MIL/uL (ref 4.22–5.81)

## 2013-02-25 LAB — COMPREHENSIVE METABOLIC PANEL
Alkaline Phosphatase: 115 U/L (ref 39–117)
BUN: 11 mg/dL (ref 6–23)
Creatinine, Ser: 0.9 mg/dL (ref 0.50–1.35)
GFR calc Af Amer: >90 mL/min (ref >90)
Glucose, Bld: 149 mg/dL — ABNORMAL HIGH (ref 70–99)
Potassium: 3.8 mEq/L (ref 3.5–5.1)
Total Protein: 7.4 g/dL (ref 6.0–8.3)

## 2013-02-25 LAB — TROPONIN I: Troponin I: <0.3 ng/mL (ref <0.30)

## 2013-02-25 LAB — D-DIMER, QUANTITATIVE: D-Dimer, Quant: <0.27 ug/mL-FEU (ref 0.00–0.48)

## 2013-02-25 NOTE — ED Provider Notes (Addendum)
History  This chart was scribed for Gilda Crease, by Ardeen Jourdain, ED Scribe. This patient was seen in room MH07/MH07 and the patient's care was started at 2254.  CSN: [id number]  Arrival date & time 02/25/13  2143   First MD Initiated Contact with Patient 02/25/13 2254      Chief Complaint  Patient presents with  . Chest Pain     The history is provided by the patient. No language interpreter was used.     Dean Hunt is a 37 y.o. male who presents to the Emergency Department complaining of gradually worsening, sudden onset, intermittent CP that began 3 days ago with associated SOB. He states he will have intermittent sharp and stabbing pains that begin at his left shoulder and radiates across his chest every few minutes. He denies any fever, nausea, emesis, diarrhea, cough, congestion, rhinorrhea and back pain as associated symptoms. He states the pain is not aggravated or relieved by anything.    Past Medical History  Diagnosis Date  . Hypertension   . Anxiety     Past Surgical History  Procedure Laterality Date  . Wisdom tooth extraction    . Tonsillectomy      History reviewed. No pertinent family history.  History  Substance Use Topics  . Smoking status: Current Every Day Smoker -- 0.50 packs/day    Types: Cigarettes  . Smokeless tobacco: Not on file  . Alcohol Use: No      Review of Systems  Constitutional: Negative for fever and chills.  HENT: Negative for congestion, rhinorrhea and sneezing.   Respiratory: Positive for shortness of breath. Negative for cough.   Cardiovascular: Positive for chest pain.  Gastrointestinal: Negative for nausea and vomiting.  Musculoskeletal: Negative for back pain.  Neurological: Negative for weakness.  All other systems reviewed and are negative.    Allergies  Compazine and Penicillins  Home Medications   Current Outpatient Rx  Name  Route  Sig  Dispense  Refill  . acetaminophen (TYLENOL) 500 MG  tablet   Oral   Take 500 mg by mouth every 6 (six) hours as needed. Patient used this medication for his fever.         . ALPRAZolam (XANAX XR) 0.5 MG 24 hr tablet   Oral   Take 0.5 mg by mouth 1 day or 1 dose.           Marland Kitchen Alum & Mag Hydroxide-Simeth (GI COCKTAIL) SUSP suspension   Oral   Take 30 mLs by mouth 2 (two) times daily. Shake well.   150 mL   0   . imipramine (TOFRANIL) 10 MG tablet   Oral   Take 20 mg by mouth at bedtime.           Marland Kitchen loperamide (IMODIUM) 2 MG capsule   Oral   Take 1 capsule (2 mg total) by mouth 4 (four) times daily as needed for diarrhea or loose stools.   12 capsule   0   . nebivolol (BYSTOLIC) 10 MG tablet   Oral   Take 10 mg by mouth daily.           . ondansetron (ZOFRAN) 4 MG tablet   Oral   Take 1-2 tablets (4-8 mg total) by mouth every 6 (six) hours.   12 tablet   0   . pantoprazole (PROTONIX) 20 MG tablet   Oral   Take 1 tablet (20 mg total) by mouth daily.   30 tablet  0     Triage Vitals: BP 148/91  Pulse 100  Temp(Src) 98.3 F (36.8 C) (Oral)  Resp 20  Ht 5\' 11"  (1.803 m)  Wt 255 lb (115.667 kg)  BMI 35.58 kg/m2  SpO2 98%  Physical Exam  Nursing note and vitals reviewed. Constitutional: He is oriented to person, place, and time. He appears well-developed and well-nourished. No distress.  HENT:  Head: Normocephalic and atraumatic.  Eyes: EOM are normal. Pupils are equal, round, and reactive to light.  Neck: Normal range of motion. Neck supple. No tracheal deviation present.  Cardiovascular: Normal rate.   Pulmonary/Chest: Effort normal. No respiratory distress.  Abdominal: Soft. He exhibits no distension. There is generalized tenderness.  Musculoskeletal: Normal range of motion. He exhibits no edema.  Neurological: He is alert and oriented to person, place, and time.  Skin: Skin is warm and dry. He is not diaphoretic.  Psychiatric: He has a normal mood and affect. His behavior is normal.    ED Course   Procedures (including critical care time)  DIAGNOSTIC STUDIES: Oxygen Saturation is 98% on room air, normal by my interpretation.    EKG:  Date: 02/26/2013  Rate: 98  Rhythm: normal sinus rhythm  QRS Axis: normal  Intervals: normal  ST/T Wave abnormalities: normal  Conduction Disutrbances:none  Narrative Interpretation:   Old EKG Reviewed: unchanged    COORDINATION OF CARE:  11:14 PM-Discussed treatment plan which includes CBC, CMP, troponin and a CXR with pt at bedside and pt agreed to plan.    Labs Reviewed  CBC WITH DIFFERENTIAL - Abnormal; Notable for the following:    Neutrophils Relative 37 (*)    Lymphocytes Relative 48 (*)    All other components within normal limits  COMPREHENSIVE METABOLIC PANEL - Abnormal; Notable for the following:    Glucose, Bld 149 (*)    Total Bilirubin 0.1 (*)    All other components within normal limits  TROPONIN I  D-DIMER, QUANTITATIVE   Dg Chest 2 View  02/25/2013  *RADIOLOGY REPORT*  Clinical Data: Chest pain extending across the upper chest. Shortness of breath.  CHEST - 2 VIEW  Comparison: Two-view chest 02/22/2011.  Findings: Heart size is normal.  The lungs are clear.  The visualized soft tissues and bony thorax are unremarkable.  IMPRESSION: No acute cardiopulmonary disease.   Original Report Authenticated By: Marin Roberts, M.D.      Diagnosis: Atypical chest pain    MDM  Patient presents to the ER with complaints of chest pain. Pain is extremely atypical in nature. The last 3 or 4 days he has been having episodic sharp pains in the left upper chest area. Pain comes on suddenly and lasts for only moments and then resolves. It is not related to exertion. He does report, however, today that he has had some shortness of breath associated with the pain. He has a history of palpitations, continues to have the palpitations. No significant change in this.  Because of the shortness of breath, a d-dimer was ordered. It was  negative. This combined with atypical and brief nature of the episodes makes PE very unlikely. Patient is reassured. He was That he should restart his beta blocker for the palpitations. Followup with primary Dr.   I personally performed the services described in this documentation, which was scribed in my presence. The recorded information has been reviewed and is accurate.     Gilda Crease, MD 02/26/13 1610  Gilda Crease, MD 02/26/13 0100

## 2013-02-25 NOTE — ED Notes (Signed)
Pt also c/o palpitations which he has a hx of, but states these feel much different.  He saw a new primary physician and they recently switched his Toprol to Bystolic.  He has been taking an 81mg  aspirin daily since 4/514.

## 2013-02-25 NOTE — ED Notes (Signed)
Pt c/o left sided chest pain x 3 days ?

## 2013-03-04 ENCOUNTER — Encounter (HOSPITAL_BASED_OUTPATIENT_CLINIC_OR_DEPARTMENT_OTHER): Payer: Self-pay | Admitting: *Deleted

## 2013-03-04 ENCOUNTER — Emergency Department (HOSPITAL_BASED_OUTPATIENT_CLINIC_OR_DEPARTMENT_OTHER)
Admission: EM | Admit: 2013-03-04 | Discharge: 2013-03-04 | Disposition: A | Discharge status: Home / Self Care | Payer: Self-pay | Attending: Emergency Medicine | Admitting: Emergency Medicine

## 2013-03-04 ENCOUNTER — Emergency Department (HOSPITAL_BASED_OUTPATIENT_CLINIC_OR_DEPARTMENT_OTHER): Payer: Self-pay

## 2013-03-04 DIAGNOSIS — R0789 Other chest pain: Secondary | ICD-10-CM | POA: Insufficient documentation

## 2013-03-04 DIAGNOSIS — F411 Generalized anxiety disorder: Secondary | ICD-10-CM | POA: Insufficient documentation

## 2013-03-04 DIAGNOSIS — F17200 Nicotine dependence, unspecified, uncomplicated: Secondary | ICD-10-CM | POA: Insufficient documentation

## 2013-03-04 DIAGNOSIS — I10 Essential (primary) hypertension: Secondary | ICD-10-CM | POA: Insufficient documentation

## 2013-03-04 DIAGNOSIS — Z79899 Other long term (current) drug therapy: Secondary | ICD-10-CM | POA: Insufficient documentation

## 2013-03-04 DIAGNOSIS — R0602 Shortness of breath: Secondary | ICD-10-CM | POA: Insufficient documentation

## 2013-03-04 LAB — CBC WITH DIFFERENTIAL/PLATELET
Basophils Relative: 0 % (ref 0–1)
Eosinophils Absolute: 0.4 10*3/uL (ref 0.0–0.7)
Lymphs Abs: 2.2 10*3/uL (ref 0.7–4.0)
MCH: 28.7 pg (ref 26.0–34.0)
MCHC: 33.8 g/dL (ref 30.0–36.0)
Neutro Abs: 2.8 10*3/uL (ref 1.7–7.7)
Neutrophils Relative %: 45 % (ref 43–77)
Platelets: 206 10*3/uL (ref 150–400)
RBC: 5.15 MIL/uL (ref 4.22–5.81)

## 2013-03-04 LAB — BASIC METABOLIC PANEL
Chloride: 105 mEq/L (ref 96–112)
GFR calc Af Amer: >90 mL/min (ref >90)
GFR calc non Af Amer: >90 mL/min (ref >90)
Potassium: 3.9 mEq/L (ref 3.5–5.1)
Sodium: 139 mEq/L (ref 135–145)

## 2013-03-04 LAB — TROPONIN I: Troponin I: <0.3 ng/mL (ref <0.30)

## 2013-03-04 NOTE — ED Notes (Signed)
Intermittent mid-sternal/left sided CP since 02/23/13.  Seen here last week for same.  Now, the pain is radiating towards his back and he feels [facility] with exertion.  Has taken Alka-Seltzers without relief given to him by his mother because she thought he was having gas.

## 2013-03-04 NOTE — ED Provider Notes (Signed)
History  This chart was scribed for Charles B. Bernette Mayers, MD by Ardeen Jourdain, ED Scribe. This patient was seen in room MH02/MH02 and the patient's care was started at 1752.  CSN: [id number]  Arrival date & time 03/04/13  1746   First MD Initiated Contact with Patient 03/04/13 1752      Chief Complaint  Patient presents with  . Fatigue     The history is provided by the patient. No language interpreter was used.    Dean Hunt is a 37 y.o. male with a h/o HTN who presents to the Emergency Department complaining of gradually worsening, gradual onset, intermittent CP with associated SOB and fatigue. He states the pain radiates to his back. He states the intermittent CP will last for a few seconds at a time and come every 10 minutes. He denies any strenuous activity. He denies any activity that aggravates the CP. He states he can only walk a few steps without having to take a break due to SOB. He reports being evaluated here 1 week ago for similar symptoms.   Past Medical History  Diagnosis Date  . Hypertension   . Anxiety     Past Surgical History  Procedure Laterality Date  . Wisdom tooth extraction    . Tonsillectomy      No family history on file.  History  Substance Use Topics  . Smoking status: Current Every Day Smoker -- 0.50 packs/day    Types: Cigarettes  . Smokeless tobacco: Not on file  . Alcohol Use: No      Review of Systems  A complete 10 system review of systems was obtained and all systems are negative except as noted in the HPI and PMH.   Allergies  Compazine and Penicillins  Home Medications   Current Outpatient Rx  Name  Route  Sig  Dispense  Refill  . acetaminophen (TYLENOL) 500 MG tablet   Oral   Take 500 mg by mouth every 6 (six) hours as needed. Patient used this medication for his fever.         . ALPRAZolam (XANAX XR) 0.5 MG 24 hr tablet   Oral   Take 0.5 mg by mouth 1 day or 1 dose.           Marland Kitchen Alum & Mag Hydroxide-Simeth (GI  COCKTAIL) SUSP suspension   Oral   Take 30 mLs by mouth 2 (two) times daily. Shake well.   150 mL   0   . imipramine (TOFRANIL) 10 MG tablet   Oral   Take 20 mg by mouth at bedtime.           Marland Kitchen loperamide (IMODIUM) 2 MG capsule   Oral   Take 1 capsule (2 mg total) by mouth 4 (four) times daily as needed for diarrhea or loose stools.   12 capsule   0   . nebivolol (BYSTOLIC) 10 MG tablet   Oral   Take 10 mg by mouth daily.           . ondansetron (ZOFRAN) 4 MG tablet   Oral   Take 1-2 tablets (4-8 mg total) by mouth every 6 (six) hours.   12 tablet   0   . pantoprazole (PROTONIX) 20 MG tablet   Oral   Take 1 tablet (20 mg total) by mouth daily.   30 tablet   0     Triage Vitals: BP 146/85  Pulse 91  Temp(Src) 99.4 F (37.4 C) (Oral)  Resp  20  Wt 255 lb (115.667 kg)  BMI 35.58 kg/m2  SpO2 98%  Physical Exam  Nursing note and vitals reviewed. Constitutional: He is oriented to person, place, and time. He appears well-developed and well-nourished. No distress.  HENT:  Head: Normocephalic and atraumatic.  Eyes: EOM are normal. Pupils are equal, round, and reactive to light.  Neck: Normal range of motion. Neck supple.  Cardiovascular: Normal rate, regular rhythm, normal heart sounds and intact distal pulses.  Exam reveals no gallop and no friction rub.   No murmur heard. Pulmonary/Chest: Effort normal and breath sounds normal. No respiratory distress. He has no wheezes. He has no rales. He exhibits no tenderness.  Abdominal: Soft. Bowel sounds are normal. He exhibits no distension and no mass. There is no tenderness. There is no rebound and no guarding.  Musculoskeletal: Normal range of motion. He exhibits no edema and no tenderness.  Neurological: He is alert and oriented to person, place, and time. He has normal strength. No cranial nerve deficit or sensory deficit.  Skin: Skin is warm and dry. No rash noted. He is not diaphoretic.  Psychiatric: He has a normal  mood and affect.    ED Course  Procedures (including critical care time)  DIAGNOSTIC STUDIES: Oxygen Saturation is 98% on room air, normal by my interpretation.    COORDINATION OF CARE:  6:05 PM-Discussed treatment plan which includes EKG with pt at bedside and pt agreed to plan.    Labs Reviewed  CBC WITH DIFFERENTIAL - Abnormal; Notable for the following:    Monocytes Relative 13 (*)    Eosinophils Relative 7 (*)    All other components within normal limits  BASIC METABOLIC PANEL - Abnormal; Notable for the following:    Glucose, Bld 120 (*)    All other components within normal limits  TROPONIN I   Dg Chest 2 View  03/04/2013  *RADIOLOGY REPORT*  Clinical Data: Chest pain and short of breath  CHEST - 2 VIEW  Comparison: 02/25/2013  Findings: Heart size is normal.  Negative for heart failure.  No infiltrate or effusion.  Negative for mass lesion.  No change from the prior study.  IMPRESSION: No active cardiopulmonary abnormality.   Original Report Authenticated By: Janeece Riggers, M.D.      1. Atypical chest pain       MDM   Date: 03/04/2013  Rate: 83  Rhythm: normal sinus rhythm  QRS Axis: normal  Intervals: normal  ST/T Wave abnormalities: normal  Conduction Disutrbances: none  Narrative Interpretation: unremarkable   Pt with atypical pain off and on for over a week. Has been seen in the ED and evaluated previously for same with neg workup including Trop and Dimer. Doubt this is ACS/CAD. Low risk for PE or dissection. He already has PCP follow up scheduled. Advised to return for any other concerns.       I personally performed the services described in this documentation, which was scribed in my presence. The recorded information has been reviewed and is accurate.     Charles B. Bernette Mayers, MD 03/04/13 2005

## 2013-03-04 NOTE — ED Notes (Signed)
Pt left prior to receiving d/c papers

## 2013-03-04 NOTE — ED Notes (Signed)
Was seen here a week ago with chest pain. No pain has gone into his back. He has c/o fatigue and SOB.

## 2013-10-02 ENCOUNTER — Emergency Department (HOSPITAL_BASED_OUTPATIENT_CLINIC_OR_DEPARTMENT_OTHER)
Admission: EM | Admit: 2013-10-02 | Discharge: 2013-10-03 | Disposition: A | Discharge status: Home / Self Care | Payer: Self-pay | Attending: Emergency Medicine | Admitting: Emergency Medicine

## 2013-10-02 ENCOUNTER — Encounter (HOSPITAL_BASED_OUTPATIENT_CLINIC_OR_DEPARTMENT_OTHER): Payer: Self-pay | Admitting: Emergency Medicine

## 2013-10-02 DIAGNOSIS — R112 Nausea with vomiting, unspecified: Secondary | ICD-10-CM | POA: Insufficient documentation

## 2013-10-02 DIAGNOSIS — R197 Diarrhea, unspecified: Secondary | ICD-10-CM | POA: Insufficient documentation

## 2013-10-02 DIAGNOSIS — F17200 Nicotine dependence, unspecified, uncomplicated: Secondary | ICD-10-CM | POA: Insufficient documentation

## 2013-10-02 DIAGNOSIS — Z79899 Other long term (current) drug therapy: Secondary | ICD-10-CM | POA: Insufficient documentation

## 2013-10-02 DIAGNOSIS — F411 Generalized anxiety disorder: Secondary | ICD-10-CM | POA: Insufficient documentation

## 2013-10-02 DIAGNOSIS — I10 Essential (primary) hypertension: Secondary | ICD-10-CM | POA: Insufficient documentation

## 2013-10-02 DIAGNOSIS — Z880 Allergy status to penicillin: Secondary | ICD-10-CM | POA: Insufficient documentation

## 2013-10-02 LAB — COMPREHENSIVE METABOLIC PANEL
ALT: 22 U/L (ref 0–53)
Alkaline Phosphatase: 99 U/L (ref 39–117)
CO2: 25 mEq/L (ref 19–32)
Chloride: 104 mEq/L (ref 96–112)
GFR calc Af Amer: >90 mL/min (ref >90)
Glucose, Bld: 95 mg/dL (ref 70–99)
Potassium: 3.5 mEq/L (ref 3.5–5.1)
Sodium: 139 mEq/L (ref 135–145)
Total Protein: 7.4 g/dL (ref 6.0–8.3)

## 2013-10-02 LAB — CBC WITH DIFFERENTIAL/PLATELET
Eosinophils Absolute: 0.3 10*3/uL (ref 0.0–0.7)
Lymphocytes Relative: 42 % (ref 12–46)
Lymphs Abs: 3.1 10*3/uL (ref 0.7–4.0)
Neutro Abs: 2.9 10*3/uL (ref 1.7–7.7)
Neutrophils Relative %: 40 % — ABNORMAL LOW (ref 43–77)
Platelets: 240 10*3/uL (ref 150–400)
RBC: 4.99 MIL/uL (ref 4.22–5.81)
WBC: 7.2 10*3/uL (ref 4.0–10.5)

## 2013-10-02 MED ORDER — SODIUM CHLORIDE 0.9 % IV BOLUS (SEPSIS)
1000.0000 mL | Freq: Once | INTRAVENOUS | Status: AC
  Administered 2013-10-02: 1000 mL via INTRAVENOUS

## 2013-10-02 MED ORDER — ONDANSETRON HCL 4 MG/2ML IJ SOLN
4.0000 mg | Freq: Once | INTRAMUSCULAR | Status: AC
  Administered 2013-10-02: 4 mg via INTRAVENOUS
  Filled 2013-10-02: qty 2

## 2013-10-02 NOTE — ED Notes (Signed)
C/o vomiting and diarrhea since Saturday, vomited x 1 today and diarrhea numerous times.

## 2013-10-02 NOTE — ED Notes (Signed)
MD at bedside. 

## 2013-10-02 NOTE — ED Provider Notes (Signed)
CSN: [id number]     Arrival date & time 10/02/13  2212 History  This chart was scribed for [name], MD by Bennett Scrape, ED Scribe. This patient was seen in room MH12/MH12 and the patient's care was started at 11:09 PM.    Chief Complaint  Patient presents with  . Emesis  . Diarrhea    Patient is a 37 y.o. male presenting with vomiting. The history is provided by the patient. No language interpreter was used.  Emesis Severity:  Mild Duration: this morning. Number of daily episodes:  1 Quality:  Stomach contents Chronicity:  New Recent urination:  Normal Context: not post-tussive   Ineffective treatments:  None tried Associated symptoms: diarrhea   Associated symptoms: no abdominal pain, no chills, no fever and no sore throat   Risk factors: sick contacts     HPI Comments: Dean Hunt is a 37 y.o. male who presents to the Emergency Department complaining of emesis that started today with associated diarrhea for the past 5 days. He reports one episode of emesis after eating breakfast this morning and several episodes of diarrhea today. He admits to having a sick contact with fevers, diarrhea and emesis that he cared for one week. He denies any fevers or sore throat. He has a h/o significant for HTN and anxiety. He is a current everyday smoker.  PCP is Dr. Maisie Fus [facility]  Past Medical History  Diagnosis Date  . Hypertension   . Anxiety    Past Surgical History  Procedure Laterality Date  . Wisdom tooth extraction    . Tonsillectomy     History reviewed. No pertinent family history. History  Substance Use Topics  . Smoking status: Current Every Day Smoker -- 0.50 packs/day    Types: Cigarettes  . Smokeless tobacco: Not on file  . Alcohol Use: No    Review of Systems  Constitutional: Negative for chills.  HENT: Negative for sore throat.   Gastrointestinal: Positive for nausea, vomiting and diarrhea. Negative for abdominal pain.  All other  systems reviewed and are negative.    Allergies  Compazine and Penicillins  Home Medications   Current Outpatient Rx  Name  Route  Sig  Dispense  Refill  . ALPRAZolam (XANAX XR) 0.5 MG 24 hr tablet   Oral   Take 0.5 mg by mouth 1 day or 1 dose.           Marland Kitchen imipramine (TOFRANIL) 10 MG tablet   Oral   Take 10 mg by mouth at bedtime.         . nebivolol (BYSTOLIC) 10 MG tablet   Oral   Take 10 mg by mouth daily.         Marland Kitchen acetaminophen (TYLENOL) 500 MG tablet   Oral   Take 500 mg by mouth every 6 (six) hours as needed. Patient used this medication for his fever.          Triage Vitals: BP 148/93  Pulse 84  Temp(Src) 98.5 F (36.9 C) (Oral)  Resp 16  Ht 5\' 11"  (1.803 m)  Wt 258 lb (117.028 kg)  BMI 36.00 kg/m2  SpO2 100%  Physical Exam  Nursing note and vitals reviewed. Constitutional: He is oriented to person, place, and time. He appears well-developed and well-nourished. No distress.  HENT:  Head: Normocephalic and atraumatic.  Moist MM  Eyes: Conjunctivae and EOM are normal. Pupils are equal, round, and reactive to light.  Neck: Neck supple. No tracheal deviation  present.  No anterior or posterior cervical lymphadenopathy. No palpable neck glands  Cardiovascular: Normal rate, regular rhythm and normal heart sounds.   Pulmonary/Chest: Effort normal and breath sounds normal. No respiratory distress.  Abdominal: Soft. Bowel sounds are increased. There is no tenderness.  Musculoskeletal: Normal range of motion.  Lymphadenopathy:    He has no cervical adenopathy.       Right: No supraclavicular and no epitrochlear adenopathy present.       Left: No supraclavicular and no epitrochlear adenopathy present.  No popliteal lymphadenopathy   Neurological: He is alert and oriented to person, place, and time. He has normal reflexes.  Skin: Skin is warm and dry.  Psychiatric: He has a normal mood and affect. His behavior is normal.    ED Course  Procedures  (including critical care time)  Medications  sodium chloride 0.9 % bolus 1,000 mL (1,000 mLs Intravenous New Bag/Given 10/02/13 2306)  ondansetron (ZOFRAN) injection 4 mg (4 mg Intravenous Given 10/02/13 2306)    DIAGNOSTIC STUDIES: Oxygen Saturation is 100% on room air, normal by my interpretation.    COORDINATION OF CARE: 11:10 PM-Discussed treatment plan which includes IV fluids, medications, CBC panel, CMP and UA with pt at bedside and pt agreed to plan.   Labs Review Labs Reviewed - No data to display Imaging Review No results found.  EKG Interpretation   None       MDM  No diagnosis found. Doing well stable for discharge will give probiotics and bland diet.    I personally performed the services described in this documentation, which was scribed in my presence. The recorded information has been reviewed and is accurate.    Jasmine Awe, MD 10/03/13 [phone]

## 2013-10-03 ENCOUNTER — Emergency Department (HOSPITAL_BASED_OUTPATIENT_CLINIC_OR_DEPARTMENT_OTHER): Payer: Self-pay

## 2013-10-03 LAB — URINALYSIS, ROUTINE W REFLEX MICROSCOPIC
Bilirubin Urine: NEGATIVE
Ketones, ur: NEGATIVE mg/dL
Nitrite: NEGATIVE
pH: 6.5 (ref 5.0–8.0)

## 2013-10-03 MED ORDER — GI COCKTAIL ~~LOC~~
30.0000 mL | Freq: Once | ORAL | Status: AC
  Administered 2013-10-03: 30 mL via ORAL

## 2013-10-03 MED ORDER — GI COCKTAIL ~~LOC~~
ORAL | Status: AC
  Filled 2013-10-03: qty 30

## 2013-10-03 MED ORDER — KETOROLAC TROMETHAMINE 30 MG/ML IJ SOLN
INTRAMUSCULAR | Status: AC
  Filled 2013-10-03: qty 1

## 2013-10-03 MED ORDER — KETOROLAC TROMETHAMINE 30 MG/ML IJ SOLN
30.0000 mg | Freq: Once | INTRAMUSCULAR | Status: AC
  Administered 2013-10-03: 30 mg via INTRAVENOUS

## 2013-10-03 MED ORDER — CULTURELLE DIGESTIVE HEALTH PO CAPS: 1.0000 | ORAL_CAPSULE | Freq: Three times a day (TID) | ORAL | Status: DC

## 2014-08-12 ENCOUNTER — Emergency Department (HOSPITAL_BASED_OUTPATIENT_CLINIC_OR_DEPARTMENT_OTHER)
Admission: EM | Admit: 2014-08-12 | Discharge: 2014-08-12 | Disposition: A | Discharge status: Home / Self Care | Payer: 59 | Attending: Emergency Medicine | Admitting: Emergency Medicine

## 2014-08-12 ENCOUNTER — Encounter (HOSPITAL_BASED_OUTPATIENT_CLINIC_OR_DEPARTMENT_OTHER): Payer: Self-pay | Admitting: Emergency Medicine

## 2014-08-12 DIAGNOSIS — Z79899 Other long term (current) drug therapy: Secondary | ICD-10-CM | POA: Insufficient documentation

## 2014-08-12 DIAGNOSIS — Z880 Allergy status to penicillin: Secondary | ICD-10-CM | POA: Insufficient documentation

## 2014-08-12 DIAGNOSIS — N390 Urinary tract infection, site not specified: Secondary | ICD-10-CM | POA: Insufficient documentation

## 2014-08-12 DIAGNOSIS — R6883 Chills (without fever): Secondary | ICD-10-CM | POA: Insufficient documentation

## 2014-08-12 DIAGNOSIS — M545 Low back pain, unspecified: Secondary | ICD-10-CM | POA: Insufficient documentation

## 2014-08-12 DIAGNOSIS — F17200 Nicotine dependence, unspecified, uncomplicated: Secondary | ICD-10-CM | POA: Insufficient documentation

## 2014-08-12 DIAGNOSIS — I10 Essential (primary) hypertension: Secondary | ICD-10-CM | POA: Insufficient documentation

## 2014-08-12 DIAGNOSIS — F411 Generalized anxiety disorder: Secondary | ICD-10-CM | POA: Insufficient documentation

## 2014-08-12 LAB — URINE MICROSCOPIC-ADD ON

## 2014-08-12 LAB — URINALYSIS, ROUTINE W REFLEX MICROSCOPIC
Bilirubin Urine: NEGATIVE
GLUCOSE, UA: NEGATIVE mg/dL
Hgb urine dipstick: NEGATIVE
KETONES UR: NEGATIVE mg/dL
NITRITE: NEGATIVE
PROTEIN: NEGATIVE mg/dL
Specific Gravity, Urine: 1.021 (ref 1.005–1.030)
Urobilinogen, UA: 1 mg/dL (ref 0.0–1.0)
pH: 6 (ref 5.0–8.0)

## 2014-08-12 MED ORDER — NITROFURANTOIN MONOHYD MACRO 100 MG PO CAPS
100.0000 mg | ORAL_CAPSULE | Freq: Once | ORAL | Status: AC
  Administered 2014-08-12: 100 mg via ORAL
  Filled 2014-08-12: qty 1

## 2014-08-12 MED ORDER — IBUPROFEN 800 MG PO TABS
800.0000 mg | ORAL_TABLET | Freq: Once | ORAL | Status: AC
  Administered 2014-08-12: 800 mg via ORAL
  Filled 2014-08-12: qty 1

## 2014-08-12 MED ORDER — NAPROXEN 375 MG PO TABS: 375.0000 mg | ORAL_TABLET | Freq: Two times a day (BID) | ORAL | Status: DC

## 2014-08-12 MED ORDER — NITROFURANTOIN MONOHYD MACRO 100 MG PO CAPS: 100.0000 mg | ORAL_CAPSULE | Freq: Two times a day (BID) | ORAL | Status: DC

## 2014-08-12 NOTE — ED Notes (Signed)
Pt c/o middle lower back pain x1 week with discomfort when urinating.

## 2014-08-12 NOTE — ED Provider Notes (Signed)
CSN: [id number]     Arrival date & time 08/12/14  0010 History  This chart was scribed for Dean Dishner Smitty Cords, MD by Charline Bills, ED Scribe. The patient was seen in room MH04/MH04. Patient's care was started at 12:32 AM.   Chief Complaint  Patient presents with  . Back Pain   Patient is a 38 y.o. male presenting with back pain. The history is provided by the patient. No language interpreter was used.  Back Pain Location:  Lumbar spine Quality:  Aching Radiates to:  Does not radiate Pain severity:  Moderate Pain is:  Same all the time Onset quality:  Gradual Duration:  8 days Timing:  Constant Progression:  Unchanged Chronicity:  New Context: not emotional stress, not jumping from heights, not lifting heavy objects, not MCA, not MVA, not occupational injury, not pedestrian accident, not physical stress, not recent illness, not recent injury and not twisting   Relieved by:  Nothing Worsened by:  Nothing tried Ineffective treatments:  NSAIDs Associated symptoms: dysuria   Associated symptoms: no abdominal pain, no abdominal swelling, no bladder incontinence, no bowel incontinence, no chest pain, no fever, no headaches, no leg pain, no numbness, no paresthesias, no pelvic pain, no perianal numbness, no tingling, no weakness and no weight loss   Risk factors: no hx of cancer    HPI Comments: Dean Hunt is a 38 y.o. male who presents to the Emergency Department complaining of constant middle lower back pain onset 8 days ago. He also reports associated intermittent chills and discomfort with urinating but denies a burning sensation. He also denies fever, penile discharge, new partners, partners with infections. He states that he was recently tested for STDs which were normal. Pt reports h/o UTIs. Pt has taken one dose ibuprofen a week ago and baking soda and water with no relief.   Past Medical History  Diagnosis Date  . Hypertension   . Anxiety    Past Surgical History  Procedure  Laterality Date  . Wisdom tooth extraction    . Tonsillectomy     No family history on file. History  Substance Use Topics  . Smoking status: Current Every Day Smoker -- 0.50 packs/day    Types: Cigarettes  . Smokeless tobacco: Not on file  . Alcohol Use: Yes    Review of Systems  Constitutional: Positive for chills. Negative for fever and weight loss.  Cardiovascular: Negative for chest pain.  Gastrointestinal: Negative for abdominal pain and bowel incontinence.  Genitourinary: Positive for dysuria. Negative for bladder incontinence, discharge and pelvic pain.  Musculoskeletal: Positive for back pain.  Neurological: Negative for tingling, weakness, numbness, headaches and paresthesias.  All other systems reviewed and are negative.  Allergies  Compazine and Penicillins  Home Medications   Prior to Admission medications   Medication Sig Start Date End Date Taking? Authorizing Provider  acetaminophen (TYLENOL) 500 MG tablet Take 500 mg by mouth every 6 (six) hours as needed. Patient used this medication for his fever.    Historical Provider, MD  ALPRAZolam (XANAX XR) 0.5 MG 24 hr tablet Take 0.5 mg by mouth 1 day or 1 dose.      Historical Provider, MD  imipramine (TOFRANIL) 10 MG tablet Take 10 mg by mouth at bedtime.    Historical Provider, MD  Lactobacillus-Inulin (CULTURELLE DIGESTIVE HEALTH) CAPS Take 1 capsule by mouth 3 (three) times daily. 10/03/13   Elridge Stemm K Andre Gallego-Rasch, MD  nebivolol (BYSTOLIC) 10 MG tablet Take 10 mg by mouth daily.  Historical Provider, MD   Triage Vitals: BP 132/87  Pulse 74  Temp(Src) 98.3 F (36.8 C) (Oral)  Resp 18  Ht  (1.803 m)  Wt 305 lb (138.347 kg)  BMI 42.56 kg/m2  SpO2 99% Physical Exam  Nursing note and vitals reviewed. Constitutional: He is oriented to person, place, and time. He appears well-developed and well-nourished. No distress.  HENT:  Head: Normocephalic and atraumatic.  Mouth/Throat: Oropharynx is clear and  moist.  Eyes: Conjunctivae and EOM are normal. Pupils are equal, round, and reactive to light.  Neck: Normal range of motion. Neck supple. No tracheal deviation present.  Cardiovascular: Normal rate, regular rhythm and intact distal pulses.   Pulmonary/Chest: Effort normal and breath sounds normal. No respiratory distress.  Abdominal: Soft. Bowel sounds are normal. There is no tenderness. There is no rebound and no guarding.  Musculoskeletal: Normal range of motion. He exhibits no edema and no tenderness.  gait  Neurological: He is alert and oriented to person, place, and time. He has normal reflexes.  Skin: Skin is warm and dry.  Psychiatric: He has a normal mood and affect. His behavior is normal.   ED Course  Procedures (including critical care time) DIAGNOSTIC STUDIES: Oxygen Saturation is 99% on RA, normal by my interpretation.    COORDINATION OF CARE: 12:35 AM-Discussed treatment plan which includes UA with pt at bedside and pt agreed to plan.   Labs Review Labs Reviewed  URINALYSIS, ROUTINE W REFLEX MICROSCOPIC - Abnormal; Notable for the following:    Leukocytes, UA MODERATE (*)    All other components within normal limits  URINE MICROSCOPIC-ADD ON   Imaging Review No results found.   EKG Interpretation None      MDM   Final diagnoses:  None   Refused penile swabs.  Given self reported frequent UTIs in a male will refer to urology for ongoing testing and care.  Return for fevers, vomiting, abdominal pain or any new or concerning symptoms  I personally performed the services described in this documentation, which was scribed in my presence. The recorded information has been reviewed and is accurate.    Jasmine Awe, MD 08/12/14 [phone]

## 2014-08-25 ENCOUNTER — Encounter (HOSPITAL_BASED_OUTPATIENT_CLINIC_OR_DEPARTMENT_OTHER): Payer: Self-pay | Admitting: Emergency Medicine

## 2014-08-25 ENCOUNTER — Emergency Department (HOSPITAL_BASED_OUTPATIENT_CLINIC_OR_DEPARTMENT_OTHER)
Admission: EM | Admit: 2014-08-25 | Discharge: 2014-08-25 | Disposition: A | Discharge status: Home / Self Care | Payer: 59 | Attending: Emergency Medicine | Admitting: Emergency Medicine

## 2014-08-25 DIAGNOSIS — N390 Urinary tract infection, site not specified: Secondary | ICD-10-CM

## 2014-08-25 DIAGNOSIS — Z720 Tobacco use: Secondary | ICD-10-CM | POA: Insufficient documentation

## 2014-08-25 DIAGNOSIS — Z880 Allergy status to penicillin: Secondary | ICD-10-CM | POA: Insufficient documentation

## 2014-08-25 DIAGNOSIS — F419 Anxiety disorder, unspecified: Secondary | ICD-10-CM | POA: Insufficient documentation

## 2014-08-25 DIAGNOSIS — Z79899 Other long term (current) drug therapy: Secondary | ICD-10-CM | POA: Insufficient documentation

## 2014-08-25 DIAGNOSIS — Z791 Long term (current) use of non-steroidal anti-inflammatories (NSAID): Secondary | ICD-10-CM | POA: Insufficient documentation

## 2014-08-25 DIAGNOSIS — I10 Essential (primary) hypertension: Secondary | ICD-10-CM | POA: Insufficient documentation

## 2014-08-25 LAB — URINALYSIS, ROUTINE W REFLEX MICROSCOPIC
Bilirubin Urine: NEGATIVE
Glucose, UA: NEGATIVE mg/dL
Hgb urine dipstick: NEGATIVE
KETONES UR: NEGATIVE mg/dL
NITRITE: NEGATIVE
PROTEIN: NEGATIVE mg/dL
Specific Gravity, Urine: 1.026 (ref 1.005–1.030)
Urobilinogen, UA: 0.2 mg/dL (ref 0.0–1.0)
pH: 5.5 (ref 5.0–8.0)

## 2014-08-25 LAB — BASIC METABOLIC PANEL
Anion gap: 13 (ref 5–15)
BUN: 11 mg/dL (ref 6–23)
CO2: 24 meq/L (ref 19–32)
CREATININE: 0.9 mg/dL (ref 0.50–1.35)
Calcium: 9.3 mg/dL (ref 8.4–10.5)
Chloride: 103 mEq/L (ref 96–112)
GFR calc non Af Amer: >90 mL/min (ref >90)
Glucose, Bld: 116 mg/dL — ABNORMAL HIGH (ref 70–99)
Potassium: 4.1 mEq/L (ref 3.7–5.3)
Sodium: 140 mEq/L (ref 137–147)

## 2014-08-25 LAB — CBC
HCT: 42.2 % (ref 39.0–52.0)
Hemoglobin: 13.9 g/dL (ref 13.0–17.0)
MCH: 28.1 pg (ref 26.0–34.0)
MCHC: 32.9 g/dL (ref 30.0–36.0)
MCV: 85.3 fL (ref 78.0–100.0)
Platelets: 239 10*3/uL (ref 150–400)
RBC: 4.95 MIL/uL (ref 4.22–5.81)
RDW: 14.6 % (ref 11.5–15.5)
WBC: 8 10*3/uL (ref 4.0–10.5)

## 2014-08-25 LAB — URINE MICROSCOPIC-ADD ON

## 2014-08-25 MED ORDER — SULFAMETHOXAZOLE-TRIMETHOPRIM 800-160 MG PO TABS: 1.0000 | ORAL_TABLET | Freq: Two times a day (BID) | ORAL | Status: DC

## 2014-08-25 MED ORDER — CIPROFLOXACIN HCL 500 MG PO TABS: 500.0000 mg | ORAL_TABLET | Freq: Two times a day (BID) | ORAL | Status: DC

## 2014-08-25 NOTE — Discharge Instructions (Signed)
Return to the ED with any concerns including fever/chills, vomiting and not able to keep down liquids or antibiotics, abdominal pain, decreased level of alertness/lethargy, or any other alarming symptoms °

## 2014-08-25 NOTE — ED Provider Notes (Signed)
CSN: [id number]     Arrival date & time 08/25/14  0602 History   First MD Initiated Contact with Patient 08/25/14 [phone]     Chief Complaint  Patient presents with  . uti symptoms      (Consider location/radiation/quality/duration/timing/severity/associated sxs/prior Treatment) HPI Pt presents with c/o dysuria.  He states he has urinary urgency and frequency associated with burning.  No fever/chills. Also c/o bilateral low back pain.  Pt was placed on macrobid  X 7 day course for UTI at ED visit on 9/22. He states he initially felt better for first 2-3 days but dysuria and back pain resumed.  No vomiting.  No trauma or injury to back. No weakness of legs, no urinary retention.  Has not seen blood in his urine.  He states that approx 2 weeks ago he was tested for STDs and the tests were negative through the health department.  He states 2 days ago he called health department to recheck on the results and they were infact negative.  He states he has not been sexually active since the tests were done.  No hx of STDs.  No penile discharge. No testicular pain or scrotal swelling.  There are no other associated systemic symptoms, there are no other alleviating or modifying factors.   He states he called urology for followup- next appointment was 10/15 so he did not make appointment.    Past Medical History  Diagnosis Date  . Hypertension   . Anxiety    Past Surgical History  Procedure Laterality Date  . Wisdom tooth extraction    . Tonsillectomy     No family history on file. History  Substance Use Topics  . Smoking status: Current Every Day Smoker -- 0.50 packs/day    Types: Cigarettes  . Smokeless tobacco: Not on file  . Alcohol Use: Yes     Comment: social     Review of Systems ROS reviewed and all otherwise negative except for mentioned in HPI    Allergies  Compazine and Penicillins  Home Medications   Prior to Admission medications   Medication Sig Start Date End Date Taking?  Authorizing Provider  acetaminophen (TYLENOL) 500 MG tablet Take 500 mg by mouth every 6 (six) hours as needed. Patient used this medication for his fever.    Historical Provider, MD  ALPRAZolam (XANAX XR) 0.5 MG 24 hr tablet Take 0.5 mg by mouth 1 day or 1 dose.      Historical Provider, MD  ciprofloxacin (CIPRO) 500 MG tablet Take 1 tablet (500 mg total) by mouth 2 (two) times daily. 08/25/14   Ethelda Chick, MD  imipramine (TOFRANIL) 10 MG tablet Take 10 mg by mouth at bedtime.    Historical Provider, MD  Lactobacillus-Inulin (CULTURELLE DIGESTIVE HEALTH) CAPS Take 1 capsule by mouth 3 (three) times daily. 10/03/13   April K Palumbo-Rasch, MD  naproxen (NAPROSYN) 375 MG tablet Take 1 tablet (375 mg total) by mouth 2 (two) times daily. 08/12/14   April K Palumbo-Rasch, MD  nebivolol (BYSTOLIC) 10 MG tablet Take 10 mg by mouth daily.    Historical Provider, MD  nitrofurantoin, macrocrystal-monohydrate, (MACROBID) 100 MG capsule Take 1 capsule (100 mg total) by mouth 2 (two) times daily. X 7 days 08/12/14   April K Palumbo-Rasch, MD   BP 130/90  Pulse 55  Temp(Src) 97.7 F (36.5 C) (Oral)  Resp 18  Ht 5\' 11"  (1.803 m)  Wt 305 lb (138.347 kg)  BMI 42.56 kg/m2  SpO2  98% Vitals reviewed Physical Exam Physical Examination: General appearance - alert, well appearing, and in no distress Mental status - alert, oriented to person, place, and time Eyes - no conjunctival injection, no scleral icterus Mouth - mucous membranes moist, pharynx normal without lesions Chest - clear to auscultation, no wheezes, rales or rhonchi, symmetric air entry Heart - normal rate, regular rhythm, normal S1, S2, no murmurs, rubs, clicks or gallops Abdomen - soft, nontender, nondistended, no masses or organomegaly Back exam - full range of motion, no midline tenderness, mild bilateral paraspinal tenderness, no CVA tenderness, no pain on motion Extremities - peripheral pulses normal, no pedal edema, no clubbing or  cyanosis Skin - normal coloration and turgor, no rashes  ED Course  Procedures (including critical care time) Labs Review Labs Reviewed  URINALYSIS, ROUTINE W REFLEX MICROSCOPIC - Abnormal; Notable for the following:    Leukocytes, UA MODERATE (*)    All other components within normal limits  BASIC METABOLIC PANEL - Abnormal; Notable for the following:    Glucose, Bld 116 (*)    All other components within normal limits  GC/CHLAMYDIA PROBE AMP  URINE CULTURE  CBC  URINE MICROSCOPIC-ADD ON    Imaging Review No results found.   EKG Interpretation None      MDM   Final diagnoses:  Urinary tract infection without hematuria, site unspecified    Pt presenting with c/o dysuria- was recently treated with macrobid course for UTI.  Today, urine culture sent, he continues to have WBCs in urine tonight. No abdominal pain or penile discharge.  He is nontoxic appearing and has no fever of vomiting.  No scrotal pain to suggest epididymitis.  Recently had negative STD testing, repeated gc/chlamydia tonight.  Labs reassuring.  Pt encouraged again to arrange for urology followup.  Will treat with cipro.  Discharged with strict return precautions.  Pt agreeable with plan.    Ethelda ChickMartha K Linker, MD 08/25/14 [phone]

## 2014-08-25 NOTE — ED Notes (Signed)
States he has been recently treated for a UTI. States he was treated here with an antibiotic and was feeling better but once the antibiotic was completed he started having lower back pain and pain with urination and frequency.  Denies fevers. Denies any trauma. Denies n/v chills. Denies any visible blood in his urine. Denies any penile discharge.

## 2014-08-26 LAB — GC/CHLAMYDIA PROBE AMP
CT Probe RNA: NEGATIVE
GC Probe RNA: NEGATIVE

## 2014-08-27 LAB — URINE CULTURE

## 2015-01-29 ENCOUNTER — Emergency Department (HOSPITAL_BASED_OUTPATIENT_CLINIC_OR_DEPARTMENT_OTHER): Payer: Self-pay

## 2015-01-29 ENCOUNTER — Encounter (HOSPITAL_BASED_OUTPATIENT_CLINIC_OR_DEPARTMENT_OTHER): Payer: Self-pay | Admitting: Emergency Medicine

## 2015-01-29 ENCOUNTER — Emergency Department (HOSPITAL_BASED_OUTPATIENT_CLINIC_OR_DEPARTMENT_OTHER)
Admission: EM | Admit: 2015-01-29 | Discharge: 2015-01-30 | Disposition: A | Discharge status: Home / Self Care | Payer: Self-pay | Attending: Emergency Medicine | Admitting: Emergency Medicine

## 2015-01-29 DIAGNOSIS — J029 Acute pharyngitis, unspecified: Secondary | ICD-10-CM | POA: Insufficient documentation

## 2015-01-29 DIAGNOSIS — Z79899 Other long term (current) drug therapy: Secondary | ICD-10-CM | POA: Insufficient documentation

## 2015-01-29 DIAGNOSIS — R0602 Shortness of breath: Secondary | ICD-10-CM | POA: Insufficient documentation

## 2015-01-29 DIAGNOSIS — Z792 Long term (current) use of antibiotics: Secondary | ICD-10-CM | POA: Insufficient documentation

## 2015-01-29 DIAGNOSIS — Z880 Allergy status to penicillin: Secondary | ICD-10-CM | POA: Insufficient documentation

## 2015-01-29 DIAGNOSIS — I10 Essential (primary) hypertension: Secondary | ICD-10-CM | POA: Insufficient documentation

## 2015-01-29 DIAGNOSIS — K219 Gastro-esophageal reflux disease without esophagitis: Secondary | ICD-10-CM | POA: Insufficient documentation

## 2015-01-29 DIAGNOSIS — Z791 Long term (current) use of non-steroidal anti-inflammatories (NSAID): Secondary | ICD-10-CM | POA: Insufficient documentation

## 2015-01-29 DIAGNOSIS — Z720 Tobacco use: Secondary | ICD-10-CM | POA: Insufficient documentation

## 2015-01-29 DIAGNOSIS — F419 Anxiety disorder, unspecified: Secondary | ICD-10-CM | POA: Insufficient documentation

## 2015-01-29 NOTE — ED Notes (Signed)
Patient reports chest tightness, shortness of breath, feels like heart is in his throat, reports he could feel his heart beat in his left arm, sore throat.  Reports sore throat x 1 week.  Chest pain and shortness of breath x 2 days.

## 2015-01-29 NOTE — ED Provider Notes (Signed)
CSN: [id number]     Arrival date & time 01/29/15  1951 History  This chart was scribed for Peterson Mathey, MD by Annye Asa, ED Scribe. This patient was seen in room MH10/MH10 and the patient's care was started at 11:57 PM.    Chief Complaint  Patient presents with  . Chest Pain   Patient is a 39 y.o. male presenting with chest pain. The history is provided by the patient. No language interpreter was used.  Chest Pain Pain location:  Substernal area (into throat) Pain quality: burning   Pain radiates to:  Does not radiate Pain radiates to the back: no   Pain severity:  Mild Onset quality:  Gradual Duration:  3 days Timing:  Constant Progression:  Unchanged Chronicity:  New Context: at rest   Relieved by:  Nothing Worsened by:  Nothing tried Ineffective treatments:  None tried Associated symptoms: heartburn and shortness of breath   Risk factors: male sex     HPI Comments: Dean Hunt is a 39 y.o. male with past medical history of HTN, anxiety who presents to the Emergency Department of 3 days of central chest discomfort, SOB, and sore throat. He notes two weeks of cough and cold symptoms, including "losing his voice." He notes that his current chest discomfort feels similar to acid reflux. He has utilized OTC medications, lozenges, and Emergen-C for symptoms.   Patient reports that he has daily bowel movements.   Past Medical History  Diagnosis Date  . Hypertension   . Anxiety    Past Surgical History  Procedure Laterality Date  . Wisdom tooth extraction    . Tonsillectomy     History reviewed. No pertinent family history. History  Substance Use Topics  . Smoking status: Current Every Day Smoker -- 0.50 packs/day    Types: Cigarettes  . Smokeless tobacco: Not on file  . Alcohol Use: Yes     Comment: social     Review of Systems  HENT: Positive for sore throat.   Respiratory: Positive for shortness of breath.        Worse lying flat   Cardiovascular: Positive for  chest pain.  Gastrointestinal: Positive for heartburn.  All other systems reviewed and are negative.   Allergies  Compazine and Penicillins  Home Medications   Prior to Admission medications   Medication Sig Start Date End Date Taking? Authorizing Provider  acetaminophen (TYLENOL) 500 MG tablet Take 500 mg by mouth every 6 (six) hours as needed. Patient used this medication for his fever.    Historical Provider, MD  ALPRAZolam (XANAX XR) 0.5 MG 24 hr tablet Take 0.5 mg by mouth 1 day or 1 dose.      Historical Provider, MD  ciprofloxacin (CIPRO) 500 MG tablet Take 1 tablet (500 mg total) by mouth 2 (two) times daily. 08/25/14   Jerelyn Scott, MD  imipramine (TOFRANIL) 10 MG tablet Take 10 mg by mouth at bedtime.    Historical Provider, MD  Lactobacillus-Inulin (CULTURELLE DIGESTIVE HEALTH) CAPS Take 1 capsule by mouth 3 (three) times daily. 10/03/13   Elvie Palomo, MD  naproxen (NAPROSYN) 375 MG tablet Take 1 tablet (375 mg total) by mouth 2 (two) times daily. 08/12/14   Huston Stonehocker, MD  nebivolol (BYSTOLIC) 10 MG tablet Take 10 mg by mouth daily.    Historical Provider, MD  nitrofurantoin, macrocrystal-monohydrate, (MACROBID) 100 MG capsule Take 1 capsule (100 mg total) by mouth 2 (two) times daily. X 7 days 08/12/14   Brittani Purdum, MD  BP 149/99 mmHg  Pulse 66  Temp(Src) 98.9 F (37.2 C) (Oral)  Ht 5\' 11"  (1.803 m)  Wt 270 lb (122.471 kg)  BMI 37.67 kg/m2  SpO2 100% Physical Exam  Constitutional: He is oriented to person, place, and time. He appears well-developed and well-nourished. No distress.  HENT:  Head: Normocephalic and atraumatic.  Mouth/Throat: Oropharynx is clear and moist. No oropharyngeal exudate.  Moist mucous membranes  Eyes: EOM are normal. Pupils are equal, round, and reactive to light.  Neck: Normal range of motion. Neck supple. No JVD present.  Cardiovascular: Normal rate, regular rhythm and normal heart sounds.  Exam reveals no gallop and no friction rub.    No murmur heard. Pulmonary/Chest: Effort normal and breath sounds normal. No respiratory distress. He has no wheezes. He has no rales. He exhibits no tenderness.  Abdominal: Soft. Bowel sounds are normal. He exhibits no mass. There is no tenderness. There is no rebound and no guarding.  Hyperactive bowel sounds heard into the thoracic and upper abdominal cavities. Stool palpable throughout the colon.   Musculoskeletal: Normal range of motion. He exhibits no edema or tenderness.  Moves all extremities normally.   Lymphadenopathy:    He has no cervical adenopathy.  Neurological: He is alert and oriented to person, place, and time. He displays normal reflexes.  Skin: Skin is warm and dry. No rash noted.  Psychiatric: He has a normal mood and affect. His behavior is normal.  Nursing note and vitals reviewed.   ED Course  Procedures   DIAGNOSTIC STUDIES: Oxygen Saturation is 100% on RA, normal by my interpretation.    COORDINATION OF CARE: 12:03 AM Discussed treatment plan with pt at bedside and pt agreed to plan.   Labs Review Labs Reviewed - No data to display  Imaging Review No results found.   EKG Interpretation None      EKG Interpretation  Date/Time:  Thursday January 29 2015 20:34:10 EST Ventricular Rate:  77 PR Interval:  156 QRS Duration: 90 QT Interval:  350 QTC Calculation: 396 R Axis:   13 Text Interpretation:  Normal sinus rhythm Confirmed by [facility]  MD, Gerrianne Aydelott ([id number]) on 01/30/2015 12:50:00 AM       MDM   Final diagnoses:  None  PERC negative wells 0 no leg swelling or pain highly doubt PE.  In the setting of ongoing symptoms > 8 hours 1 negative EKG and troponin excludes acs.  GERD precautions given follow up with your PMD for ongoing care  I personally performed the services described in this documentation, which was scribed in my presence. The recorded information has been reviewed and is accurate.    Cy BlamerApril Carylon Tamburro, MD 01/30/15 [phone]

## 2015-01-29 NOTE — ED Notes (Signed)
Alert, NAD, calm, interactive, c/o sore throat, mentions some chest discomfort, "has been using lozenges and emergence-C", throat most bother some, "think I am fighting a cold, have been losing my voice for 2 weeks", voice is hoarse, LS CTA, throat red and irritated.

## 2015-01-30 ENCOUNTER — Encounter (HOSPITAL_BASED_OUTPATIENT_CLINIC_OR_DEPARTMENT_OTHER): Payer: Self-pay | Admitting: Emergency Medicine

## 2015-01-30 LAB — CBC WITH DIFFERENTIAL/PLATELET
Basophils Absolute: 0.1 10*3/uL (ref 0.0–0.1)
Basophils Relative: 1 % (ref 0–1)
EOS ABS: 0.4 10*3/uL (ref 0.0–0.7)
EOS PCT: 4 % (ref 0–5)
HEMATOCRIT: 45.5 % (ref 39.0–52.0)
HEMOGLOBIN: 14.8 g/dL (ref 13.0–17.0)
LYMPHS PCT: 44 % (ref 12–46)
Lymphs Abs: 4.6 10*3/uL — ABNORMAL HIGH (ref 0.7–4.0)
MCH: 27.6 pg (ref 26.0–34.0)
MCHC: 32.5 g/dL (ref 30.0–36.0)
MCV: 84.7 fL (ref 78.0–100.0)
Monocytes Absolute: 1.1 10*3/uL — ABNORMAL HIGH (ref 0.1–1.0)
Monocytes Relative: 11 % (ref 3–12)
Neutro Abs: 4.2 10*3/uL (ref 1.7–7.7)
Neutrophils Relative %: 40 % — ABNORMAL LOW (ref 43–77)
PLATELETS: 242 10*3/uL (ref 150–400)
RBC: 5.37 MIL/uL (ref 4.22–5.81)
RDW: 14.9 % (ref 11.5–15.5)
WBC: 10.4 10*3/uL (ref 4.0–10.5)

## 2015-01-30 LAB — BASIC METABOLIC PANEL
Anion gap: 7 (ref 5–15)
BUN: 14 mg/dL (ref 6–23)
CHLORIDE: 107 mmol/L (ref 96–112)
CO2: 27 mmol/L (ref 19–32)
CREATININE: 0.96 mg/dL (ref 0.50–1.35)
Calcium: 8.8 mg/dL (ref 8.4–10.5)
GFR calc Af Amer: >90 mL/min (ref >90)
GFR calc non Af Amer: >90 mL/min (ref >90)
GLUCOSE: 108 mg/dL — AB (ref 70–99)
POTASSIUM: 4.2 mmol/L (ref 3.5–5.1)
SODIUM: 141 mmol/L (ref 135–145)

## 2015-01-30 LAB — TROPONIN I

## 2015-01-30 MED ORDER — GI COCKTAIL ~~LOC~~
30.0000 mL | Freq: Once | ORAL | Status: AC
  Administered 2015-01-30: 30 mL via ORAL
  Filled 2015-01-30: qty 30

## 2015-01-30 MED ORDER — OMEPRAZOLE 20 MG PO CPDR: 20.0000 mg | DELAYED_RELEASE_CAPSULE | Freq: Every day | ORAL | Status: DC

## 2015-01-30 NOTE — ED Notes (Signed)
EDP at [facility] 

## 2015-11-20 ENCOUNTER — Emergency Department (HOSPITAL_BASED_OUTPATIENT_CLINIC_OR_DEPARTMENT_OTHER): Payer: Self-pay

## 2015-11-20 ENCOUNTER — Encounter (HOSPITAL_BASED_OUTPATIENT_CLINIC_OR_DEPARTMENT_OTHER): Payer: Self-pay | Admitting: *Deleted

## 2015-11-20 ENCOUNTER — Emergency Department (HOSPITAL_BASED_OUTPATIENT_CLINIC_OR_DEPARTMENT_OTHER)
Admission: EM | Admit: 2015-11-20 | Discharge: 2015-11-20 | Disposition: A | Discharge status: Home / Self Care | Payer: Self-pay | Attending: Emergency Medicine | Admitting: Emergency Medicine

## 2015-11-20 DIAGNOSIS — Z791 Long term (current) use of non-steroidal anti-inflammatories (NSAID): Secondary | ICD-10-CM | POA: Insufficient documentation

## 2015-11-20 DIAGNOSIS — Z79899 Other long term (current) drug therapy: Secondary | ICD-10-CM | POA: Insufficient documentation

## 2015-11-20 DIAGNOSIS — I10 Essential (primary) hypertension: Secondary | ICD-10-CM | POA: Insufficient documentation

## 2015-11-20 DIAGNOSIS — M791 Myalgia: Secondary | ICD-10-CM | POA: Insufficient documentation

## 2015-11-20 DIAGNOSIS — J069 Acute upper respiratory infection, unspecified: Secondary | ICD-10-CM | POA: Insufficient documentation

## 2015-11-20 DIAGNOSIS — F17210 Nicotine dependence, cigarettes, uncomplicated: Secondary | ICD-10-CM | POA: Insufficient documentation

## 2015-11-20 DIAGNOSIS — Z880 Allergy status to penicillin: Secondary | ICD-10-CM | POA: Insufficient documentation

## 2015-11-20 DIAGNOSIS — Z792 Long term (current) use of antibiotics: Secondary | ICD-10-CM | POA: Insufficient documentation

## 2015-11-20 DIAGNOSIS — F419 Anxiety disorder, unspecified: Secondary | ICD-10-CM | POA: Insufficient documentation

## 2015-11-20 NOTE — Discharge Instructions (Signed)
Continue using over-the-counter medications for your symptoms. Stay out of work if you have a fever. Use Tylenol or ibuprofen for fever control.   Upper Respiratory Infection, Adult Most upper respiratory infections (URIs) are a viral infection of the air passages leading to the lungs. A URI affects the nose, throat, and upper air passages. The most common type of URI is nasopharyngitis and is typically referred to as "the common cold." URIs run their course and usually go away on their own. Most of the time, a URI does not require medical attention, but sometimes a bacterial infection in the upper airways can follow a viral infection. This is called a secondary infection. Sinus and middle ear infections are common types of secondary upper respiratory infections. Bacterial pneumonia can also complicate a URI. A URI can worsen asthma and chronic obstructive pulmonary disease (COPD). Sometimes, these complications can require emergency medical care and may be life threatening.  CAUSES Almost all URIs are caused by viruses. A virus is a type of germ and can spread from one person to another.  RISKS FACTORS You may be at risk for a URI if:   You smoke.   You have chronic heart or lung disease.  You have a weakened defense (immune) system.   You are very young or very old.   You have nasal allergies or asthma.  You work in crowded or poorly ventilated areas.  You work in health care facilities or schools. SIGNS AND SYMPTOMS  Symptoms typically develop 2-3 days after you come in contact with a cold virus. Most viral URIs last 7-10 days. However, viral URIs from the influenza virus (flu virus) can last 14-18 days and are typically more severe. Symptoms may include:   Runny or stuffy (congested) nose.   Sneezing.   Cough.   Sore throat.   Headache.   Fatigue.   Fever.   Loss of appetite.   Pain in your forehead, behind your eyes, and over your cheekbones (sinus  pain).  Muscle aches.  DIAGNOSIS  Your health care provider may diagnose a URI by:  Physical exam.  Tests to check that your symptoms are not due to another condition such as:  Strep throat.  Sinusitis.  Pneumonia.  Asthma. TREATMENT  A URI goes away on its own with time. It cannot be cured with medicines, but medicines may be prescribed or recommended to relieve symptoms. Medicines may help:  Reduce your fever.  Reduce your cough.  Relieve nasal congestion. HOME CARE INSTRUCTIONS   Take medicines only as directed by your health care provider.   Gargle warm saltwater or take cough drops to comfort your throat as directed by your health care provider.  Use a warm mist humidifier or inhale steam from a shower to increase air moisture. This may make it easier to breathe.  Drink enough fluid to keep your urine clear or pale yellow.   Eat soups and other clear broths and maintain good nutrition.   Rest as needed.   Return to work when your temperature has returned to normal or as your health care provider advises. You may need to stay home longer to avoid infecting others. You can also use a face mask and careful hand washing to prevent spread of the virus.  Increase the usage of your inhaler if you have asthma.   Do not use any tobacco products, including cigarettes, chewing tobacco, or electronic cigarettes. If you need help quitting, ask your health care provider. PREVENTION  The  best way to protect yourself from getting a cold is to practice good hygiene.   Avoid oral or hand contact with people with cold symptoms.   Wash your hands often if contact occurs.  There is no clear evidence that vitamin C, vitamin E, echinacea, or exercise reduces the chance of developing a cold. However, it is always recommended to get plenty of rest, exercise, and practice good nutrition.  SEEK MEDICAL CARE IF:   You are getting worse rather than better.   Your symptoms are  not controlled by medicine.   You have chills.  You have worsening shortness of breath.  You have brown or red mucus.  You have yellow or brown nasal discharge.  You have pain in your face, especially when you bend forward.  You have a fever.  You have swollen neck glands.  You have pain while swallowing.  You have white areas in the back of your throat. SEEK IMMEDIATE MEDICAL CARE IF:   You have severe or persistent:  Headache.  Ear pain.  Sinus pain.  Chest pain.  You have chronic lung disease and any of the following:  Wheezing.  Prolonged cough.  Coughing up blood.  A change in your usual mucus.  You have a stiff neck.  You have changes in your:  Vision.  Hearing.  Thinking.  Mood. MAKE SURE YOU:   Understand these instructions.  Will watch your condition.  Will get help right away if you are not doing well or get worse.   This information is not intended to replace advice given to you by your health care provider. Make sure you discuss any questions you have with your health care provider.   Document Released: 05/03/2001 Document Revised: 03/24/2015 Document Reviewed: 02/12/2014 Elsevier Interactive Patient Education [employer].

## 2015-11-20 NOTE — ED Provider Notes (Signed)
CSN: [id number]     Arrival date & time 11/20/15  1613 History   First MD Initiated Contact with Patient 11/20/15 1753     Chief Complaint  Patient presents with  . Influenza   HPI  Dean Hunt is a 39 year old male with PMHx of HTN and anxiety presenting with fever, congestion, sore throat, cough and myalgias 5 days. He notes purulent nasal discharge with his congestion. He states that he is also having a productive cough of green sputum. He states that "whenever is coming out my nose is also coming up with my cough". He states that his throat feels irritated which also causes a cough. The pain in his throat is most severe during coughing. Swallowing does not exacerbate the pain. Denies difficulty handling secretions, swallowing or breathing. He reports fevers over 100 at home which he has been treating with ibuprofen. He did not get his flu shot this year. Denies chills, headaches, dizziness, syncope, ear pain, eye redness, eye discharge, neck pain, chest pain, SOB, abdominal pain, nausea or vomiting. Patient did get his flu shot this year. Denies known sick contacts or recent travel.  Past Medical History  Diagnosis Date  . Hypertension   . Anxiety    Past Surgical History  Procedure Laterality Date  . Wisdom tooth extraction    . Tonsillectomy     No family history on file. Social History  Substance Use Topics  . Smoking status: Current Every Day Smoker -- 0.50 packs/day    Types: Cigarettes  . Smokeless tobacco: None  . Alcohol Use: Yes     Comment: social     Review of Systems  Constitutional: Positive for fever. Negative for diaphoresis.  HENT: Positive for congestion and sore throat. Negative for ear pain, rhinorrhea, sinus pressure and trouble swallowing.   Eyes: Negative for discharge and redness.  Respiratory: Positive for cough. Negative for shortness of breath.   Gastrointestinal: Negative for nausea, vomiting and abdominal pain.  Genitourinary: Negative.    Musculoskeletal: Positive for myalgias.  Neurological: Negative for dizziness, syncope and headaches.  All other systems reviewed and are negative.     Allergies  Compazine and Penicillins  Home Medications   Prior to Admission medications   Medication Sig Start Date End Date Taking? Authorizing Provider  acetaminophen (TYLENOL) 500 MG tablet Take 500 mg by mouth every 6 (six) hours as needed. Patient used this medication for his fever.    Historical Provider, MD  ALPRAZolam (XANAX XR) 0.5 MG 24 hr tablet Take 0.5 mg by mouth 1 day or 1 dose.      Historical Provider, MD  ciprofloxacin (CIPRO) 500 MG tablet Take 1 tablet (500 mg total) by mouth 2 (two) times daily. 08/25/14   Jerelyn ScottMartha Linker, MD  imipramine (TOFRANIL) 10 MG tablet Take 10 mg by mouth at bedtime.    Historical Provider, MD  Lactobacillus-Inulin (CULTURELLE DIGESTIVE HEALTH) CAPS Take 1 capsule by mouth 3 (three) times daily. 10/03/13   April Palumbo, MD  naproxen (NAPROSYN) 375 MG tablet Take 1 tablet (375 mg total) by mouth 2 (two) times daily. 08/12/14   April Palumbo, MD  nebivolol (BYSTOLIC) 10 MG tablet Take 10 mg by mouth daily.    Historical Provider, MD  nitrofurantoin, macrocrystal-monohydrate, (MACROBID) 100 MG capsule Take 1 capsule (100 mg total) by mouth 2 (two) times daily. X 7 days 08/12/14   April Palumbo, MD  omeprazole (PRILOSEC) 20 MG capsule Take 1 capsule (20 mg total) by mouth daily.  01/30/15   April Palumbo, MD   BP 142/97 mmHg  Pulse 105  Temp(Src) 98.4 F (36.9 C) (Oral)  Resp 18  Ht  (1.803 m)  Wt 122.471 kg  BMI 37.67 kg/m2  SpO2 99% Physical Exam  Constitutional: He appears well-developed and well-nourished. No distress.  HENT:  Head: Normocephalic and atraumatic.  Right Ear: Tympanic membrane and ear canal normal.  Left Ear: Tympanic membrane and ear canal normal.  Nose: Mucosal edema present. Right sinus exhibits no maxillary sinus tenderness and no frontal sinus tenderness. Left  sinus exhibits no maxillary sinus tenderness and no frontal sinus tenderness.  Mouth/Throat: Uvula is midline. Mucous membranes are not dry. No uvula swelling. Posterior oropharyngeal erythema present. No oropharyngeal exudate or posterior oropharyngeal edema.  Postnasal drip noted.   Eyes: Conjunctivae and EOM are normal. Right eye exhibits no discharge. Left eye exhibits no discharge. No scleral icterus.  Neck: Normal range of motion. Neck supple.  Cardiovascular: Normal rate, regular rhythm and normal heart sounds.   Pulmonary/Chest: Effort normal and breath sounds normal. No respiratory distress. He has no wheezes. He has no rales.  Abdominal: Soft. There is no tenderness.  Musculoskeletal: Normal range of motion.  Moves all extremities spontaneously  Lymphadenopathy:    He has no cervical adenopathy.  Neurological: He is alert. Coordination normal.  Skin: Skin is warm and dry. No rash noted.  Psychiatric: He has a normal mood and affect. His behavior is normal.  Nursing note and vitals reviewed.   ED Course  Procedures (including critical care time) Labs Review Labs Reviewed - No data to display  Imaging Review Dg Chest 2 View  11/20/2015  CLINICAL DATA:  Pt co cough, chest congestion, fever, bodyaches x 1 week EXAM: CHEST  2 VIEW COMPARISON:  03/31/2015 FINDINGS: The heart size and mediastinal contours are within normal limits. Both lungs are clear. The visualized skeletal structures are unremarkable. IMPRESSION: No active cardiopulmonary disease. Electronically Signed   By: Norva Pavlov M.D.   On: 11/20/2015 19:04   I have personally reviewed and evaluated these images and lab results as part of my medical decision-making.   EKG Interpretation None      MDM   Final diagnoses:  URI (upper respiratory infection)   Patient presenting with fever, congestion, cough and myalgias x 5 days. VSS. Pt is nontoxic appearing. Nasal musosal edema noted. TMs pearly Fedrick without  erythema or effusion. Oropharynx erythematous without edema or exudate. Lungs CTAB. CXR negative for acute infiltrate. Patients symptoms are consistent with URI, likely viral etiology. Discussed that antibiotics are not indicated for viral infections. Pt will be discharged with symptomatic treatment. Verbalizes understanding and is agreeable with plan. Pt is hemodynamically stable & in NAD prior to dc.     Alveta Heimlich, PA-C 11/20/15 1610  Margarita Grizzle, MD 11/21/15 [phone]

## 2015-11-20 NOTE — ED Notes (Signed)
Cough, fever, congestion, body aches for 5 days.

## 2015-11-24 ENCOUNTER — Encounter (HOSPITAL_BASED_OUTPATIENT_CLINIC_OR_DEPARTMENT_OTHER): Payer: Self-pay | Admitting: *Deleted

## 2015-11-24 ENCOUNTER — Emergency Department (HOSPITAL_BASED_OUTPATIENT_CLINIC_OR_DEPARTMENT_OTHER): Payer: Self-pay

## 2015-11-24 ENCOUNTER — Emergency Department (HOSPITAL_BASED_OUTPATIENT_CLINIC_OR_DEPARTMENT_OTHER)
Admission: EM | Admit: 2015-11-24 | Discharge: 2015-11-24 | Disposition: A | Discharge status: Home / Self Care | Payer: Self-pay | Attending: Emergency Medicine | Admitting: Emergency Medicine

## 2015-11-24 DIAGNOSIS — F419 Anxiety disorder, unspecified: Secondary | ICD-10-CM | POA: Insufficient documentation

## 2015-11-24 DIAGNOSIS — E663 Overweight: Secondary | ICD-10-CM | POA: Insufficient documentation

## 2015-11-24 DIAGNOSIS — M791 Myalgia: Secondary | ICD-10-CM | POA: Insufficient documentation

## 2015-11-24 DIAGNOSIS — J0110 Acute frontal sinusitis, unspecified: Secondary | ICD-10-CM | POA: Insufficient documentation

## 2015-11-24 DIAGNOSIS — F17210 Nicotine dependence, cigarettes, uncomplicated: Secondary | ICD-10-CM | POA: Insufficient documentation

## 2015-11-24 DIAGNOSIS — Z792 Long term (current) use of antibiotics: Secondary | ICD-10-CM | POA: Insufficient documentation

## 2015-11-24 DIAGNOSIS — Z880 Allergy status to penicillin: Secondary | ICD-10-CM | POA: Insufficient documentation

## 2015-11-24 DIAGNOSIS — Z79899 Other long term (current) drug therapy: Secondary | ICD-10-CM | POA: Insufficient documentation

## 2015-11-24 DIAGNOSIS — R000 Tachycardia, unspecified: Secondary | ICD-10-CM | POA: Insufficient documentation

## 2015-11-24 DIAGNOSIS — I10 Essential (primary) hypertension: Secondary | ICD-10-CM | POA: Insufficient documentation

## 2015-11-24 DIAGNOSIS — Z791 Long term (current) use of non-steroidal anti-inflammatories (NSAID): Secondary | ICD-10-CM | POA: Insufficient documentation

## 2015-11-24 DIAGNOSIS — R6889 Other general symptoms and signs: Secondary | ICD-10-CM

## 2015-11-24 LAB — CBC WITH DIFFERENTIAL/PLATELET
Basophils Absolute: 0 10*3/uL (ref 0.0–0.1)
Basophils Relative: 0 %
Eosinophils Absolute: 0.4 10*3/uL (ref 0.0–0.7)
Eosinophils Relative: 4 %
HCT: 38.5 % — ABNORMAL LOW (ref 39.0–52.0)
HEMOGLOBIN: 12.3 g/dL — AB (ref 13.0–17.0)
LYMPHS PCT: 32 %
Lymphs Abs: 3.6 10*3/uL (ref 0.7–4.0)
MCH: 27.2 pg (ref 26.0–34.0)
MCHC: 31.9 g/dL (ref 30.0–36.0)
MCV: 85.2 fL (ref 78.0–100.0)
MONOS PCT: 11 %
Monocytes Absolute: 1.2 10*3/uL — ABNORMAL HIGH (ref 0.1–1.0)
NEUTROS PCT: 53 %
Neutro Abs: 6 10*3/uL (ref 1.7–7.7)
Platelets: 299 10*3/uL (ref 150–400)
RBC: 4.52 MIL/uL (ref 4.22–5.81)
RDW: 14.4 % (ref 11.5–15.5)
WBC: 11.2 10*3/uL — ABNORMAL HIGH (ref 4.0–10.5)

## 2015-11-24 LAB — BASIC METABOLIC PANEL
Anion gap: 5 (ref 5–15)
BUN: 10 mg/dL (ref 6–20)
CHLORIDE: 109 mmol/L (ref 101–111)
CO2: 25 mmol/L (ref 22–32)
CREATININE: 0.77 mg/dL (ref 0.61–1.24)
Calcium: 8.4 mg/dL — ABNORMAL LOW (ref 8.9–10.3)
GFR calc non Af Amer: >60 mL/min (ref >60)
Glucose, Bld: 123 mg/dL — ABNORMAL HIGH (ref 65–99)
POTASSIUM: 3.7 mmol/L (ref 3.5–5.1)
SODIUM: 139 mmol/L (ref 135–145)

## 2015-11-24 LAB — RAPID STREP SCREEN (MED CTR MEBANE ONLY): Streptococcus, Group A Screen (Direct): NEGATIVE

## 2015-11-24 MED ORDER — ALBUTEROL SULFATE HFA 108 (90 BASE) MCG/ACT IN AERS
2.0000 | INHALATION_SPRAY | Freq: Four times a day (QID) | RESPIRATORY_TRACT | Status: DC | PRN
  Administered 2015-11-24: 2 via RESPIRATORY_TRACT
  Filled 2015-11-24 (×2): qty 6.7

## 2015-11-24 MED ORDER — DOXYCYCLINE HYCLATE 100 MG PO CAPS: 100.0000 mg | ORAL_CAPSULE | Freq: Two times a day (BID) | ORAL | Status: DC

## 2015-11-24 MED ORDER — ACETAMINOPHEN 325 MG PO TABS
650.0000 mg | ORAL_TABLET | Freq: Once | ORAL | Status: AC
  Administered 2015-11-24: 650 mg via ORAL
  Filled 2015-11-24: qty 2

## 2015-11-24 MED ORDER — SODIUM CHLORIDE 0.9 % IV BOLUS (SEPSIS)
1000.0000 mL | Freq: Once | INTRAVENOUS | Status: AC
  Administered 2015-11-24: 1000 mL via INTRAVENOUS

## 2015-11-24 MED ORDER — DEXAMETHASONE SODIUM PHOSPHATE 10 MG/ML IJ SOLN
10.0000 mg | Freq: Once | INTRAMUSCULAR | Status: AC
  Administered 2015-11-24: 10 mg via INTRAVENOUS
  Filled 2015-11-24: qty 1

## 2015-11-24 NOTE — ED Provider Notes (Signed)
CSN: [id number]     Arrival date & time 11/24/15  0433 History   First MD Initiated Contact with Patient 11/24/15 [phone]     Chief Complaint  Patient presents with  . Fever     (Consider location/radiation/quality/duration/timing/severity/associated sxs/prior Treatment) HPI  This is a 40 year old male with history of hypertension who presents with fever, cough, congestion, sore throat, and myalgias. Patient reports that he began having symptoms last Sunday. His first fever was last Tuesday. He was seen on Friday and diagnosed with an upper respiratory infection. He states that since that time he has had daily fever up to 103. He reports worsening sore throat, headache, congestion, and cough productive of "green stuff." Patient reports cough and sore throat is worse when he feels drainage down the back of his throat. Denies any neck stiffness. Last fever was just prior to evaluation at 103.  He took Tylenol last night at 8:30 PM. Denies any sick contacts.  Patient reports that he has used Afrin, Flonase, Netipot, and Tylenol and Motrin with minimal relief.  Past Medical History  Diagnosis Date  . Hypertension   . Anxiety    Past Surgical History  Procedure Laterality Date  . Wisdom tooth extraction    . Tonsillectomy     No family history on file. Social History  Substance Use Topics  . Smoking status: Current Every Day Smoker -- 0.50 packs/day    Types: Cigarettes  . Smokeless tobacco: None  . Alcohol Use: Yes     Comment: social     Review of Systems  Constitutional: Positive for fever and chills.  HENT: Positive for congestion, rhinorrhea and sore throat. Negative for sinus pressure, trouble swallowing and voice change.   Respiratory: Positive for cough. Negative for chest tightness and shortness of breath.   Cardiovascular: Negative.  Negative for chest pain.  Gastrointestinal: Negative.  Negative for nausea, vomiting, abdominal pain and diarrhea.  Genitourinary: Negative.   Negative for dysuria.  Musculoskeletal: Positive for myalgias. Negative for back pain.  Skin: Negative for rash.  Neurological: Positive for headaches.  All other systems reviewed and are negative.     Allergies  Compazine and Penicillins  Home Medications   Prior to Admission medications   Medication Sig Start Date End Date Taking? Authorizing Provider  acetaminophen (TYLENOL) 500 MG tablet Take 500 mg by mouth every 6 (six) hours as needed. Patient used this medication for his fever.    Historical Provider, MD  ALPRAZolam (XANAX XR) 0.5 MG 24 hr tablet Take 0.5 mg by mouth 1 day or 1 dose.      Historical Provider, MD  ciprofloxacin (CIPRO) 500 MG tablet Take 1 tablet (500 mg total) by mouth 2 (two) times daily. 08/25/14   Jerelyn Scott, MD  doxycycline (VIBRAMYCIN) 100 MG capsule Take 1 capsule (100 mg total) by mouth 2 (two) times daily. 11/24/15   Shon Baton, MD  imipramine (TOFRANIL) 10 MG tablet Take 10 mg by mouth at bedtime.    Historical Provider, MD  Lactobacillus-Inulin (CULTURELLE DIGESTIVE HEALTH) CAPS Take 1 capsule by mouth 3 (three) times daily. 10/03/13   April Palumbo, MD  naproxen (NAPROSYN) 375 MG tablet Take 1 tablet (375 mg total) by mouth 2 (two) times daily. 08/12/14   April Palumbo, MD  nebivolol (BYSTOLIC) 10 MG tablet Take 10 mg by mouth daily.    Historical Provider, MD  nitrofurantoin, macrocrystal-monohydrate, (MACROBID) 100 MG capsule Take 1 capsule (100 mg total) by mouth 2 (two) times daily.  X 7 days 08/12/14   April Palumbo, MD  omeprazole (PRILOSEC) 20 MG capsule Take 1 capsule (20 mg total) by mouth daily. 01/30/15   April Palumbo, MD   BP 143/86 mmHg  Pulse 102  Temp(Src) 98.9 F (37.2 C) (Oral)  Resp 18  Ht 5\' 11"  (1.803 m)  Wt 270 lb (122.471 kg)  BMI 37.67 kg/m2  SpO2 99% Physical Exam  Constitutional: He is oriented to person, place, and time. He appears well-developed and well-nourished.  Overweight  HENT:  Head: Normocephalic and  atraumatic.  Mouth/Throat: Oropharynx is clear and moist.  Tonsils absent, no significant erythema, no palatal petechiae, no trismus, postnasal drip noted, rhinorrhea and nasal congestion noted, tenderness palpation over the maxillary sinuses  Eyes: Pupils are equal, round, and reactive to light.  Neck: Normal range of motion. Neck supple.  No meningismus, shotty anterior lymphadenopathy  Cardiovascular: Regular rhythm and normal heart sounds.   No murmur heard. Tachycardia  Pulmonary/Chest: Effort normal and breath sounds normal. No respiratory distress. He has no wheezes.  Abdominal: Soft. Bowel sounds are normal. There is no tenderness. There is no rebound.  Musculoskeletal: He exhibits no edema.  Lymphadenopathy:    He has cervical adenopathy.  Neurological: He is alert and oriented to person, place, and time.  Skin: Skin is warm and dry. No rash noted.  Psychiatric: He has a normal mood and affect.  Nursing note and vitals reviewed.   ED Course  Procedures (including critical care time) Labs Review Labs Reviewed  CBC WITH DIFFERENTIAL/PLATELET - Abnormal; Notable for the following:    WBC 11.2 (*)    Hemoglobin 12.3 (*)    HCT 38.5 (*)    All other components within normal limits  BASIC METABOLIC PANEL - Abnormal; Notable for the following:    Glucose, Bld 123 (*)    Calcium 8.4 (*)    All other components within normal limits  RAPID STREP SCREEN (NOT AT [facility])  CULTURE, GROUP A STREP    Imaging Review Dg Chest 2 View  11/24/2015  CLINICAL DATA:  Subacute onset of fever, cough and shortness of breath. Sore throat. Initial encounter. EXAM: CHEST  2 VIEW COMPARISON:  Chest radiograph performed 11/20/2015 FINDINGS: The lungs are well-aerated. Mild peribronchial thickening is noted. There is no evidence of focal opacification, pleural effusion or pneumothorax. The heart is borderline normal in size. No acute osseous abnormalities are seen. IMPRESSION: Mild peribronchial  thickening noted.  Lungs otherwise clear. Electronically Signed   By: Roanna Raider M.D.   On: 11/24/2015 05:41   I have personally reviewed and evaluated these images and lab results as part of my medical decision-making.   EKG Interpretation None      MDM   Final diagnoses:  Flu-like symptoms  Acute frontal sinusitis, recurrence not specified    Patient presents with persistent flulike symptoms and fever. Symptoms present with fevers proximal lead in one week. He is nontoxic on exam. Febrile to 101.1.  Constellation of symptoms is most suggestive of viral etiology. However, given persistent fevers, pneumonia, influenza, sinusitis and strep are considerations. He has no signs or symptoms of deep space infection of the neck.  Patient given fluids, Tylenol, and Decadron. He is a smoker and was given an inhaler for cough. Repeat chest x-ray shows no evidence of infiltrate and strep screen is negative. Basic labwork shows a mild leukocytosis. On recheck, patient reports improvement with fluids and fever control. He continues to be comfortable appearing. Given that he  is 7 days into illness and it is maximizing supportive care at home, feel that it is reasonable to prescribe an antibiotic for possible sinus infection. Also discussed with patient that limited Sudafed may help with his nasal congestion and postnasal drip. He does have a history of hypertension and was warned that overuse of Sudafed can affect blood pressure. Follow-up with primary physician.  After history, exam, and medical workup I feel the patient has been appropriately medically screened and is safe for discharge home. Pertinent diagnoses were discussed with the patient. Patient was given return precautions.    Shon Batonourtney F Konor Noren, MD 11/24/15 [phone]

## 2015-11-24 NOTE — ED Notes (Signed)
Pt c/o fever up and down  States was 103 at 0200 this am, last tylenol at 830 last pm

## 2015-11-24 NOTE — Discharge Instructions (Signed)
Sinusitis, Adult °Sinusitis is redness, soreness, and inflammation of the paranasal sinuses. Paranasal sinuses are air pockets within the bones of your face. They are located beneath your eyes, in the middle of your forehead, and above your eyes. In healthy paranasal sinuses, mucus is able to drain out, and air is able to circulate through them by way of your nose. However, when your paranasal sinuses are inflamed, mucus and air can become trapped. This can allow bacteria and other germs to grow and cause infection. °Sinusitis can develop quickly and last only a short time (acute) or continue over a long period (chronic). Sinusitis that lasts for more than 12 weeks is considered chronic. °CAUSES °Causes of sinusitis include: °· Allergies. °· Structural abnormalities, such as displacement of the cartilage that separates your nostrils (deviated septum), which can decrease the air flow through your nose and sinuses and affect sinus drainage. °· Functional abnormalities, such as when the small hairs (cilia) that line your sinuses and help remove mucus do not work properly or are not present. °SIGNS AND SYMPTOMS °Symptoms of acute and chronic sinusitis are the same. The primary symptoms are pain and pressure around the affected sinuses. Other symptoms include: °· Upper toothache. °· Earache. °· Headache. °· Bad breath. °· Decreased sense of smell and taste. °· A cough, which worsens when you are lying flat. °· Fatigue. °· Fever. °· Thick drainage from your nose, which often is green and may contain pus (purulent). °· Swelling and warmth over the affected sinuses. °DIAGNOSIS °Your health care provider will perform a physical exam. During your exam, your health care provider may perform any of the following to help determine if you have acute sinusitis or chronic sinusitis: °· Look in your nose for signs of abnormal growths in your nostrils (nasal polyps). °· Tap over the affected sinus to check for signs of  infection. °· View the inside of your sinuses using an imaging device that has a light attached (endoscope). °If your health care provider suspects that you have chronic sinusitis, one or more of the following tests may be recommended: °· Allergy tests. °· Nasal culture. A sample of mucus is taken from your nose, sent to a lab, and screened for bacteria. °· Nasal cytology. A sample of mucus is taken from your nose and examined by your health care provider to determine if your sinusitis is related to an allergy. °TREATMENT °Most cases of acute sinusitis are related to a viral infection and will resolve on their own within 10 days. Sometimes, medicines are prescribed to help relieve symptoms of both acute and chronic sinusitis. These may include pain medicines, decongestants, nasal steroid sprays, or saline sprays. °However, for sinusitis related to a bacterial infection, your health care provider will prescribe antibiotic medicines. These are medicines that will help kill the bacteria causing the infection. °Rarely, sinusitis is caused by a fungal infection. In these cases, your health care provider will prescribe antifungal medicine. °For some cases of chronic sinusitis, surgery is needed. Generally, these are cases in which sinusitis recurs more than 3 times per year, despite other treatments. °HOME CARE INSTRUCTIONS °· Drink plenty of water. Water helps thin the mucus so your sinuses can drain more easily. °· Use a humidifier. °· Inhale steam 3-4 times a day (for example, sit in the bathroom with the shower running). °· Apply a warm, moist washcloth to your face 3-4 times a day, or as directed by your health care provider. °· Use saline nasal sprays to help   moisten and clean your sinuses.  Take medicines only as directed by your health care provider.  If you were prescribed either an antibiotic or antifungal medicine, finish it all even if you start to feel better. SEEK IMMEDIATE MEDICAL CARE IF:  You have  increasing pain or severe headaches.  You have nausea, vomiting, or drowsiness.  You have swelling around your face.  You have vision problems.  You have a stiff neck.  You have difficulty breathing.   This information is not intended to replace advice given to you by your health care provider. Make sure you discuss any questions you have with your health care provider.   Document Released: 11/07/2005 Document Revised: 11/28/2014 Document Reviewed: 11/22/2011 Elsevier Interactive Patient Education 2016 Else[employer] Inc[employer] Influenza, Adult Influenza ("the flu") is a viral infection of the respiratory tract. It occurs more often in winter months because people spend more time in close contact with one another. Influenza can make you feel very sick. Influenza easily spreads from person to person (contagious). CAUSES  Influenza is caused by a virus that infects the respiratory tract. You can catch the virus by breathing in droplets from an infected person's cough or sneeze. You can also catch the virus by touching something that was recently contaminated with the virus and then touching your mouth, nose, or eyes. RISKS AND COMPLICATIONS You may be at risk for a more severe case of influenza if you smoke cigarettes, have diabetes, have chronic heart disease (such as heart failure) or lung disease (such as asthma), or if you have a weakened immune system. Elderly people and pregnant women are also at risk for more serious infections. The most common problem of influenza is a lung infection (pneumonia). Sometimes, this problem can require emergency medical care and may be life threatening. SIGNS AND SYMPTOMS  Symptoms typically last 4 to 10 days and may include:  Fever.  Chills.  Headache, body aches, and muscle aches.  Sore throat.  Chest discomfort and cough.  Poor appetite.  Weakness or feeling tired.  Dizziness.  Nausea or vomiting. DIAGNOSIS  Diagnosis of influenza is often made  based on your history and a physical exam. A nose or throat swab test can be done to confirm the diagnosis. TREATMENT  In mild cases, influenza goes away on its own. Treatment is directed at relieving symptoms. For more severe cases, your health care provider may prescribe antiviral medicines to shorten the sickness. Antibiotic medicines are not effective because the infection is caused by a virus, not by bacteria. HOME CARE INSTRUCTIONS  Take medicines only as directed by your health care provider.  Use a cool mist humidifier to make breathing easier.  Get plenty of rest until your temperature returns to normal. This usually takes 3 to 4 days.  Drink enough fluid to keep your urine clear or pale yellow.  Cover yourmouth and nosewhen coughing or sneezing,and wash your handswellto prevent thevirusfrom spreading.  Stay homefromwork orschool untilthe fever is gonefor at least 67full day. PREVENTION  An annual influenza vaccination (flu shot) is the best way to avoid getting influenza. An annual flu shot is now routinely recommended for all adults in the [place] SEEK MEDICAL CARE IF:  You experiencechest pain, yourcough worsens,or you producemore mucus.  Youhave nausea,vomiting, ordiarrhea.  Your fever returns or gets worse. SEEK IMMEDIATE MEDICAL CARE IF:  You havetrouble breathing, you become short of breath,or your skin ornails becomebluish.  You have severe painor stiffnessin the neck.  You develop a sudden  headache, or pain in the face or ear.  You have nausea or vomiting that you cannot control. MAKE SURE YOU:   Understand these instructions.  Will watch your condition.  Will get help right away if you are not doing well or get worse.   This information is not intended to replace advice given to you by your health care provider. Make sure you discuss any questions you have with your health care provider.   Document Released: 11/04/2000 Document  Revised: 11/28/2014 Document Reviewed: 02/06/2012 Elsevier Interactive Patient Education [employer]2016 Else[employer] Inc.

## 2015-11-24 NOTE — ED Notes (Signed)
C/o fever up and down  Last had tylenol last pm at 830  Took temp this am it was 103  Did not take anything, at 4 amit was 102  Still did not take meds

## 2015-11-26 LAB — CULTURE, GROUP A STREP: Strep A Culture: NEGATIVE

## 2015-12-28 ENCOUNTER — Emergency Department (HOSPITAL_BASED_OUTPATIENT_CLINIC_OR_DEPARTMENT_OTHER): Payer: Self-pay

## 2015-12-28 ENCOUNTER — Encounter (HOSPITAL_BASED_OUTPATIENT_CLINIC_OR_DEPARTMENT_OTHER): Payer: Self-pay | Admitting: *Deleted

## 2015-12-28 ENCOUNTER — Emergency Department (HOSPITAL_BASED_OUTPATIENT_CLINIC_OR_DEPARTMENT_OTHER)
Admission: EM | Admit: 2015-12-28 | Discharge: 2015-12-28 | Disposition: A | Discharge status: Home / Self Care | Payer: Self-pay | Attending: Emergency Medicine | Admitting: Emergency Medicine

## 2015-12-28 DIAGNOSIS — J069 Acute upper respiratory infection, unspecified: Secondary | ICD-10-CM | POA: Insufficient documentation

## 2015-12-28 DIAGNOSIS — I10 Essential (primary) hypertension: Secondary | ICD-10-CM | POA: Insufficient documentation

## 2015-12-28 DIAGNOSIS — F17210 Nicotine dependence, cigarettes, uncomplicated: Secondary | ICD-10-CM | POA: Insufficient documentation

## 2015-12-28 DIAGNOSIS — F419 Anxiety disorder, unspecified: Secondary | ICD-10-CM | POA: Insufficient documentation

## 2015-12-28 DIAGNOSIS — Z792 Long term (current) use of antibiotics: Secondary | ICD-10-CM | POA: Insufficient documentation

## 2015-12-28 DIAGNOSIS — Z880 Allergy status to penicillin: Secondary | ICD-10-CM | POA: Insufficient documentation

## 2015-12-28 DIAGNOSIS — Z79899 Other long term (current) drug therapy: Secondary | ICD-10-CM | POA: Insufficient documentation

## 2015-12-28 MED ORDER — NAPROXEN 500 MG PO TABS: 500.0000 mg | ORAL_TABLET | Freq: Two times a day (BID) | ORAL | Status: DC

## 2015-12-28 MED ORDER — FLUTICASONE PROPIONATE 50 MCG/ACT NA SUSP: 2.0000 | Freq: Every day | NASAL | Status: DC

## 2015-12-28 MED ORDER — BENZONATATE 100 MG PO CAPS
200.0000 mg | ORAL_CAPSULE | Freq: Once | ORAL | Status: AC
  Administered 2015-12-28: 200 mg via ORAL
  Filled 2015-12-28: qty 2

## 2015-12-28 MED ORDER — LORATADINE 10 MG PO TABS
10.0000 mg | ORAL_TABLET | Freq: Once | ORAL | Status: AC
  Administered 2015-12-28: 10 mg via ORAL
  Filled 2015-12-28: qty 1

## 2015-12-28 MED ORDER — GUAIFENESIN 100 MG/5ML PO SOLN
5.0000 mL | Freq: Once | ORAL | Status: AC
Start: 2015-12-28 — End: 2015-12-28
  Administered 2015-12-28: 100 mg via ORAL
  Filled 2015-12-28: qty 5

## 2015-12-28 MED ORDER — BENZONATATE 100 MG PO CAPS
100.0000 mg | ORAL_CAPSULE | Freq: Three times a day (TID) | ORAL | Status: DC
Start: 2015-12-28 — End: 2017-07-26

## 2015-12-28 MED ORDER — CETIRIZINE-PSEUDOEPHEDRINE ER 5-120 MG PO TB12: 1.0000 | ORAL_TABLET | Freq: Two times a day (BID) | ORAL | Status: DC

## 2015-12-28 MED ORDER — ACETAMINOPHEN 500 MG PO TABS: 1000.0000 mg | ORAL_TABLET | Freq: Once | ORAL | Status: DC

## 2015-12-28 NOTE — ED Notes (Signed)
Tylenol take 4 hours PTA, ibuprofen 30 minutes PTA

## 2015-12-28 NOTE — ED Provider Notes (Signed)
CSN: [id number]     Arrival date & time 12/28/15  0019 History  By signing my name below, I, Bethel Born, attest that this documentation has been prepared under the direction and in the presence of Isham Smitherman, MD. Electronically Signed: Bethel Born, ED Scribe. 12/28/2015. 12:35 AM   Chief Complaint  Patient presents with  . URI   Patient is a 40 y.o. male presenting with fever. The history is provided by the patient. No language interpreter was used.  Fever Max temp prior to arrival:  103  Temp source:  Oral Severity:  Moderate Onset quality:  Gradual Timing:  Constant Progression:  Waxing and waning Chronicity:  Recurrent Relieved by:  Nothing Worsened by:  Nothing tried Ineffective treatments:  Acetaminophen and ibuprofen Associated symptoms: cough and myalgias   Associated symptoms: no chest pain, no chills, no dysuria, no ear pain, no nausea, no rhinorrhea, no sore throat and no vomiting   Cough:    Cough characteristics:  Non-productive   Severity:  Moderate   Onset quality:  Gradual   Timing:  Sporadic   Progression:  Unchanged   Chronicity:  New Risk factors: no hx of cancer and no sick contacts    Dean Hunt is a 40 y.o. male with history of HTN and anxiety who presents to the Emergency Department complaining of fever up to 17 F with onset 3 days ago. Tylenol and ibuprofen have provided insufficient relief at home. Associated symptoms include ongoing cough, a "tickle" in the throat,  and myalgias for nearly 1 month. Pt has been seen in the ED twice for these symptoms but had no improvement with doxycycline. No known sick contact.   Past Medical History  Diagnosis Date  . Hypertension   . Anxiety    Past Surgical History  Procedure Laterality Date  . Wisdom tooth extraction    . Tonsillectomy     History reviewed. No pertinent family history. Social History  Substance Use Topics  . Smoking status: Current Every Day Smoker -- 0.50 packs/day    Types:  Cigarettes  . Smokeless tobacco: None  . Alcohol Use: Yes     Comment: social     Review of Systems  Constitutional: Positive for fever. Negative for chills.  HENT: Negative for ear pain, rhinorrhea and sore throat.   Respiratory: Positive for cough. Negative for shortness of breath.   Cardiovascular: Negative for chest pain, palpitations and leg swelling.  Gastrointestinal: Negative for nausea and vomiting.  Genitourinary: Negative for dysuria.  Musculoskeletal: Positive for myalgias.  All other systems reviewed and are negative.  Allergies  Compazine and Penicillins  Home Medications   Prior to Admission medications   Medication Sig Start Date End Date Taking? Authorizing Provider  acetaminophen (TYLENOL) 500 MG tablet Take 500 mg by mouth every 6 (six) hours as needed. Patient used this medication for his fever.    Historical Provider, MD  ALPRAZolam (XANAX XR) 0.5 MG 24 hr tablet Take 0.5 mg by mouth 1 day or 1 dose.      Historical Provider, MD  ciprofloxacin (CIPRO) 500 MG tablet Take 1 tablet (500 mg total) by mouth 2 (two) times daily. 08/25/14   Jerelyn Scott, MD  doxycycline (VIBRAMYCIN) 100 MG capsule Take 1 capsule (100 mg total) by mouth 2 (two) times daily. 11/24/15   Shon Baton, MD  imipramine (TOFRANIL) 10 MG tablet Take 10 mg by mouth at bedtime.    Historical Provider, MD  Lactobacillus-Inulin (CULTURELLE DIGESTIVE HEALTH) CAPS  Take 1 capsule by mouth 3 (three) times daily. 10/03/13   Rumaysa Sabatino, MD  naproxen (NAPROSYN) 375 MG tablet Take 1 tablet (375 mg total) by mouth 2 (two) times daily. 08/12/14   Vercie Pokorny, MD  nebivolol (BYSTOLIC) 10 MG tablet Take 10 mg by mouth daily.    Historical Provider, MD  nitrofurantoin, macrocrystal-monohydrate, (MACROBID) 100 MG capsule Take 1 capsule (100 mg total) by mouth 2 (two) times daily. X 7 days 08/12/14   Traycen Goyer, MD  omeprazole (PRILOSEC) 20 MG capsule Take 1 capsule (20 mg total) by mouth daily. 01/30/15    Curry Seefeldt, MD   BP 139/90 mmHg  Pulse 98  Temp(Src) 101 F (38.3 C) (Oral)  Resp 18  Ht  (1.803 m)  Wt 270 lb (122.471 kg)  BMI 37.67 kg/m2  SpO2 98% Physical Exam  Constitutional: He is oriented to person, place, and time. He appears well-developed and well-nourished. No distress.  HENT:  Head: Normocephalic and atraumatic.  Right Ear: Tympanic membrane normal.  Left Ear: Tympanic membrane normal.  Mouth/Throat: Oropharynx is clear and moist.  Moist mucous membranes Clear colorless post nasal drip  Eyes: EOM are normal. Pupils are equal, round, and reactive to light.  Neck: Normal range of motion. Neck supple.  Trachea midline  Cardiovascular: Normal rate, regular rhythm and intact distal pulses.   Pulmonary/Chest: Effort normal and breath sounds normal. No stridor. No respiratory distress. He has no wheezes. He has no rales.  Abdominal: Soft. Bowel sounds are normal. He exhibits no mass. There is no tenderness. There is no rebound and no guarding.  Musculoskeletal: Normal range of motion.  BLEs: No chords. All compartments are soft. No deformity  Lymphadenopathy:    He has no cervical adenopathy.       Right: No supraclavicular adenopathy present.       Left: No supraclavicular adenopathy present.  Neurological: He is alert and oriented to person, place, and time. He has normal reflexes.  Skin: Skin is warm and dry.  Psychiatric: He has a normal mood and affect. His behavior is normal.  Nursing note and vitals reviewed.   ED Course  Procedures (including critical care time) DIAGNOSTIC STUDIES: Oxygen Saturation is 98% on RA,  normal by my interpretation.    COORDINATION OF CARE: 12:32 AM Discussed treatment plan which includes CXR, Tessalon, Robitussin, and Claritin with pt at bedside and pt agreed to plan.  Labs Review Labs Reviewed - No data to display  Imaging Review No results found. I have personally reviewed and evaluated these images as part of  my medical decision-making.   EKG Interpretation None      MDM   Final diagnoses:  None    Viral syndrome: will treat symptomatically.  I personally performed the services described in this documentation, which was scribed in my presence. The recorded information has been reviewed and is accurate.      Cy Blamer, MD 12/28/15 [phone]

## 2015-12-28 NOTE — ED Notes (Signed)
URI x several weeks.  Fever that started yesterday.  Productive cough.

## 2015-12-28 NOTE — ED Notes (Signed)
Assumed care of patient from [place], [place]. Pt resting quietly. No distress. Awaiting test results.

## 2016-06-09 DIAGNOSIS — I10 Essential (primary) hypertension: Secondary | ICD-10-CM | POA: Insufficient documentation

## 2016-06-09 DIAGNOSIS — R51 Headache: Secondary | ICD-10-CM | POA: Insufficient documentation

## 2016-06-09 DIAGNOSIS — Y92410 Unspecified street and highway as the place of occurrence of the external cause: Secondary | ICD-10-CM | POA: Insufficient documentation

## 2016-06-09 DIAGNOSIS — Y9389 Activity, other specified: Secondary | ICD-10-CM | POA: Insufficient documentation

## 2016-06-09 DIAGNOSIS — Y999 Unspecified external cause status: Secondary | ICD-10-CM | POA: Insufficient documentation

## 2016-06-09 DIAGNOSIS — F17210 Nicotine dependence, cigarettes, uncomplicated: Secondary | ICD-10-CM | POA: Insufficient documentation

## 2016-06-09 DIAGNOSIS — M542 Cervicalgia: Secondary | ICD-10-CM | POA: Insufficient documentation

## 2016-06-10 ENCOUNTER — Encounter (HOSPITAL_BASED_OUTPATIENT_CLINIC_OR_DEPARTMENT_OTHER): Payer: Self-pay | Admitting: *Deleted

## 2016-06-10 ENCOUNTER — Emergency Department (HOSPITAL_BASED_OUTPATIENT_CLINIC_OR_DEPARTMENT_OTHER)
Admission: EM | Admit: 2016-06-10 | Discharge: 2016-06-10 | Disposition: A | Discharge status: Home / Self Care | Payer: Self-pay | Attending: Emergency Medicine | Admitting: Emergency Medicine
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
## 2017-01-01 IMAGING — CR DG CHEST 2V
2 series · 2 of 2 positions shown · non-contrast
Comparison: Chest radiograph performed 11/24/2015

CLINICAL DATA: Subacute onset of cough.  Fever.  Initial encounter.

EXAM:
CHEST  2 VIEW

[w chest pa]
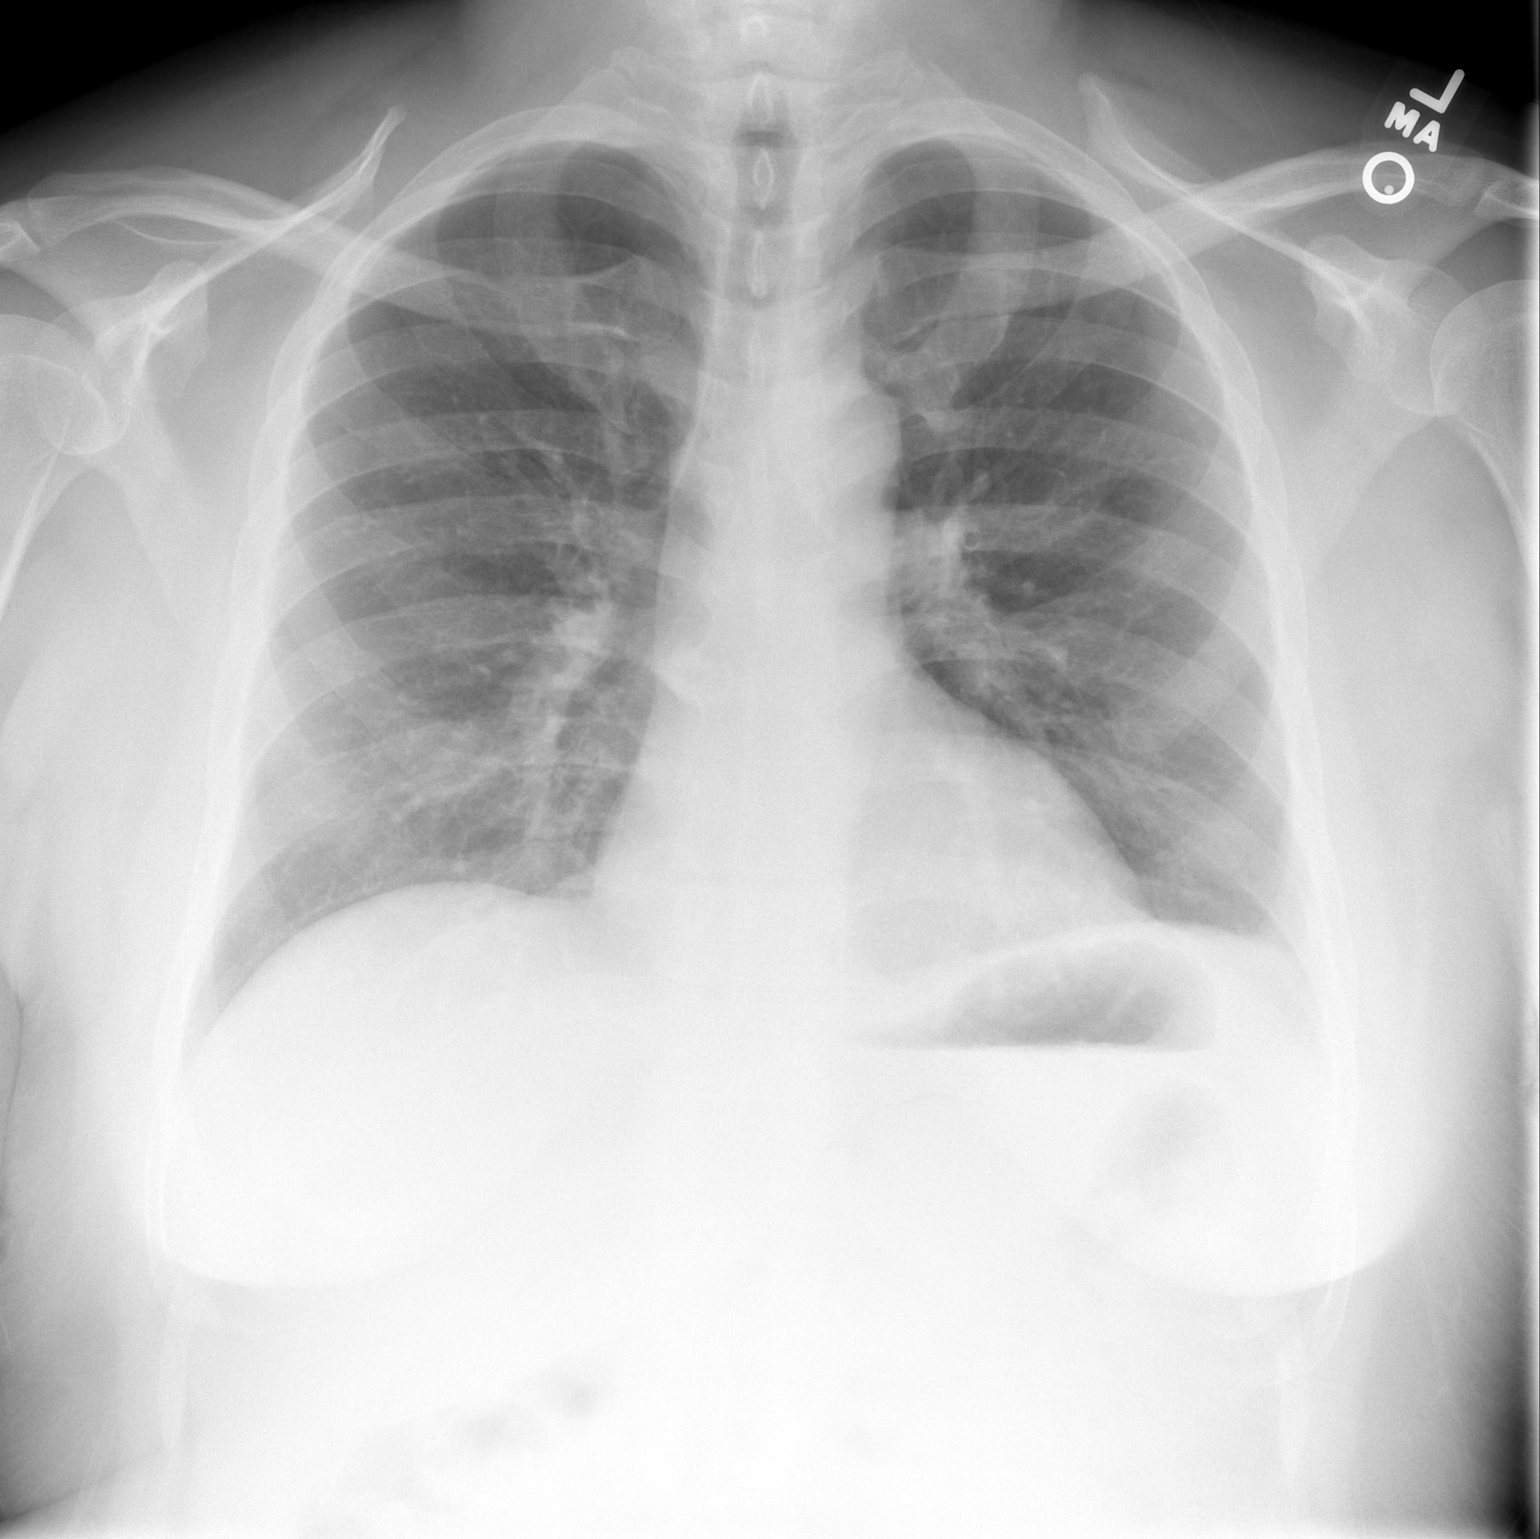

[w chest lat]
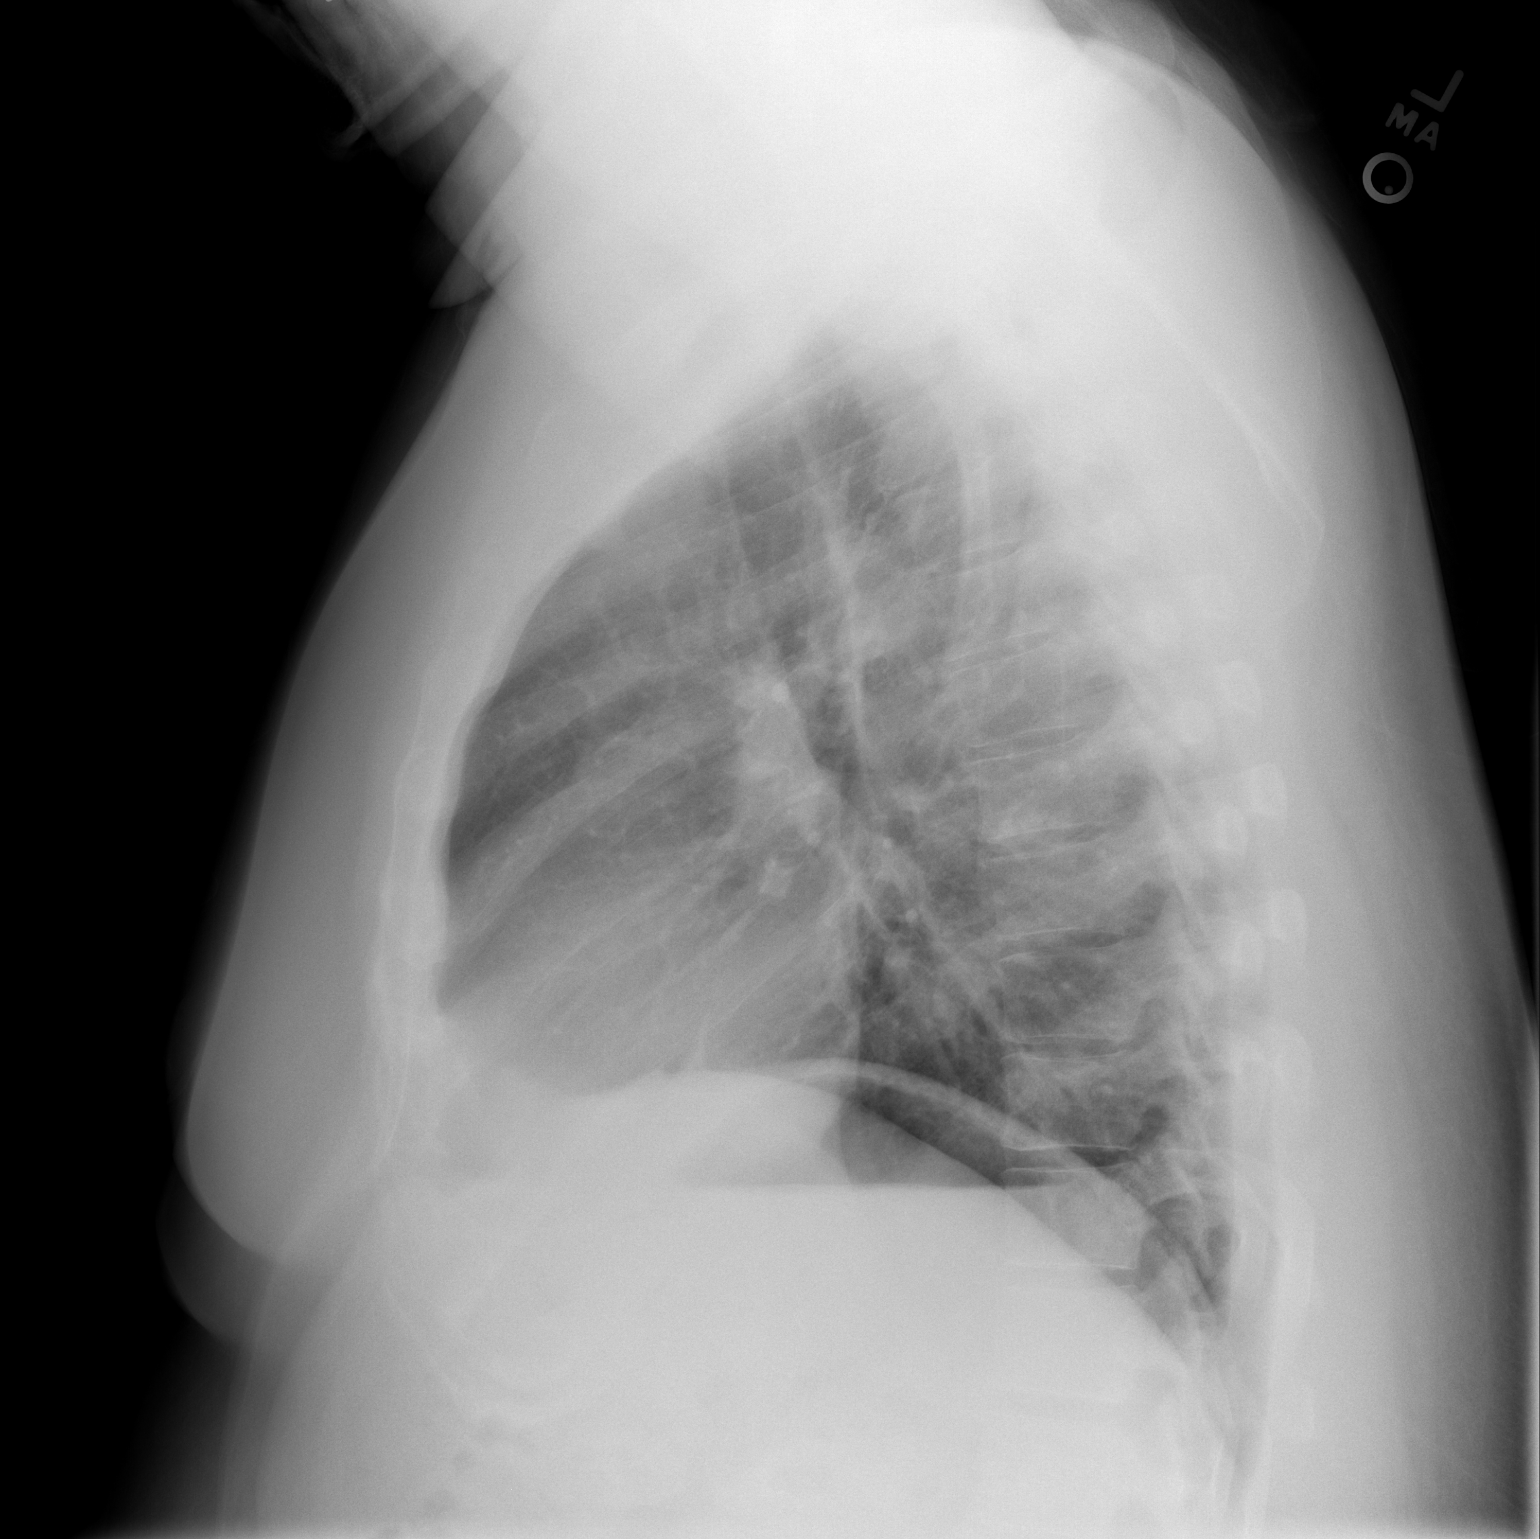

[2 of 2 positions shown; findings below may reference images not displayed]

FINDINGS: The lungs are well-aerated and clear. There is no evidence of focal
opacification, pleural effusion or pneumothorax.

The heart is normal in size; the mediastinal contour is within
normal limits. No acute osseous abnormalities are seen.
IMPRESSION: No acute cardiopulmonary process seen.

## 2017-07-26 ENCOUNTER — Emergency Department (HOSPITAL_BASED_OUTPATIENT_CLINIC_OR_DEPARTMENT_OTHER)
Admission: EM | Admit: 2017-07-26 | Discharge: 2017-07-26 | Disposition: A | Discharge status: Home / Self Care | Payer: Self-pay | Attending: Emergency Medicine | Admitting: Emergency Medicine

## 2017-07-26 ENCOUNTER — Encounter (HOSPITAL_BASED_OUTPATIENT_CLINIC_OR_DEPARTMENT_OTHER): Payer: Self-pay | Admitting: Emergency Medicine

## 2017-07-26 DIAGNOSIS — F17210 Nicotine dependence, cigarettes, uncomplicated: Secondary | ICD-10-CM | POA: Insufficient documentation

## 2017-07-26 DIAGNOSIS — Z79899 Other long term (current) drug therapy: Secondary | ICD-10-CM | POA: Insufficient documentation

## 2017-07-26 DIAGNOSIS — B349 Viral infection, unspecified: Secondary | ICD-10-CM | POA: Insufficient documentation

## 2017-07-26 DIAGNOSIS — I10 Essential (primary) hypertension: Secondary | ICD-10-CM | POA: Insufficient documentation

## 2017-07-26 HISTORY — DX: Obsessive-compulsive disorder, unspecified: F42.9

## 2017-07-26 HISTORY — DX: Depression, unspecified: F32.A

## 2017-07-26 HISTORY — DX: Major depressive disorder, single episode, unspecified: F32.9

## 2017-07-26 HISTORY — DX: Personal history of other specified conditions: Z87.898

## 2017-07-26 HISTORY — DX: Other stimulant use, unspecified, in remission: F15.91

## 2017-07-26 MED ORDER — BENZONATATE 100 MG PO CAPS: 100.0000 mg | ORAL_CAPSULE | Freq: Three times a day (TID) | ORAL | 0 refills | Status: DC

## 2017-07-26 NOTE — Discharge Instructions (Signed)
It was my pleasure taking care of you today!   Your symptoms are likely due to a viral infection. Fortunately, we did not see evidence of serious infection and can treat your symptoms. Flonase, mucinex if needed for nasal congestion, tessalon as needed for cough. Alternate between Tylenol and ibuprofen as needed for body aches / fevers.   Rest, drink plenty of fluids to be sure you are staying hydrated.   Please follow up with your primary doctor for discussion of your diagnoses and further evaluation after today's visit if symptoms persist longer than 5 more days; Return to the ER for high fevers, difficulty breathing or other concerning symptoms

## 2017-07-26 NOTE — ED Notes (Signed)
DC instructions reviewed with pt along with Rx by PA-C. Stressed importance of staying hydrated, along with rest, and good handwashing. Opportunity for questions provided

## 2017-07-26 NOTE — ED Triage Notes (Signed)
Patient reports "he just doesn't feel well" - HAs multiple complaints that include but not limited to headache, chills, aches, fever, Diarreah on Saturday, sore throat and loosing his voice.

## 2017-07-26 NOTE — ED Provider Notes (Signed)
[facility]   CSN: [id number] Arrival date & time: 07/26/17  1738     History   Chief Complaint Chief Complaint  Patient presents with  . Fever    HPI Lutricia Feildric Boeding is a 41 y.o. male.  The history is provided by the patient and medical records. No language interpreter was used.  Fever   Associated symptoms include diarrhea, congestion, sore throat and cough. Pertinent negatives include no chest pain and no vomiting.   Lutricia Feildric Zuch is a 41 y.o. male  with a PMH of HTN, anxiety who presents to the Emergency Department complaining of intermittent fever (Tmax 102) x 4 days. No fever today however. Associated symptoms include dry cough, sore throat, headache, chills, nasal congestion and diarrhea. Patient states that symptoms have been getting worse. No abdominal pain or blood in the stool. No chest pain or shortness of breath. He has been taking ibuprofen with adequate relief of fever. No other medications taken prior to arrival for symptoms. No known sick contacts.   Past Medical History:  Diagnosis Date  . Anxiety   . Hypertension     There are no active problems to display for this patient.   Past Surgical History:  Procedure Laterality Date  . TONSILLECTOMY    . WISDOM TOOTH EXTRACTION         Home Medications    Prior to Admission medications   Medication Sig Start Date End Date Taking? Authorizing Provider  metFORMIN (GLUCOPHAGE) 500 MG tablet Take by mouth 2 (two) times daily with a meal.   Yes [provider]  metoprolol succinate (TOPROL-XL) 25 MG 24 hr tablet Take 25 mg by mouth daily.   Yes [provider]  acetaminophen (TYLENOL) 500 MG tablet Take 500 mg by mouth every 6 (six) hours as needed. Patient used this medication for his fever.    [provider]  ALPRAZolam (XANAX XR) 0.5 MG 24 hr tablet Take 0.5 mg by mouth 1 day or 1 dose.      [provider]  benzonatate (TESSALON) 100 MG capsule Take 1  capsule (100 mg total) by mouth every 8 (eight) hours. 07/26/17   Lakashia Collison, Chase PicketJaime Pilcher, PA-C  cetirizine-pseudoephedrine (ZYRTEC-D ALLERGY & CONGESTION) 5-120 MG tablet Take 1 tablet by mouth 2 (two) times daily. 12/28/15   Palumbo, April, MD  ciprofloxacin (CIPRO) 500 MG tablet Take 1 tablet (500 mg total) by mouth 2 (two) times daily. 08/25/14   Mabe, Latanya MaudlinMartha L, MD  doxycycline (VIBRAMYCIN) 100 MG capsule Take 1 capsule (100 mg total) by mouth 2 (two) times daily. 11/24/15   Horton, Mayer Maskerourtney F, MD  fluticasone (FLONASE) 50 MCG/ACT nasal spray Place 2 sprays into both nostrils daily. 12/28/15   Palumbo, April, MD  imipramine (TOFRANIL) 10 MG tablet Take 10 mg by mouth at bedtime.    [provider]  Lactobacillus-Inulin (CULTURELLE DIGESTIVE HEALTH) CAPS Take 1 capsule by mouth 3 (three) times daily. 10/03/13   Palumbo, April, MD  naproxen (NAPROSYN) 375 MG tablet Take 1 tablet (375 mg total) by mouth 2 (two) times daily. 08/12/14   Palumbo, April, MD  naproxen (NAPROSYN) 500 MG tablet Take 1 tablet (500 mg total) by mouth 2 (two) times daily. 12/28/15   Palumbo, April, MD  nebivolol (BYSTOLIC) 10 MG tablet Take 10 mg by mouth daily.    [provider]  nitrofurantoin, macrocrystal-monohydrate, (MACROBID) 100 MG capsule Take 1 capsule (100 mg total) by mouth 2 (two) times daily. X 7  days 08/12/14   Palumbo, April, MD  omeprazole (PRILOSEC) 20 MG capsule Take 1 capsule (20 mg total) by mouth daily. 01/30/15   Palumbo, April, MD    Family History History reviewed. No pertinent family history.  Social History Social History  Substance Use Topics  . Smoking status: Current Every Day Smoker    Packs/day: 0.50    Types: Cigarettes  . Smokeless tobacco: Never Used  . Alcohol use Yes     Comment: social      Allergies   Compazine [prochlorperazine edisylate] and Penicillins   Review of Systems Review of Systems  Constitutional: Positive for chills and fever.  HENT: Positive for  congestion and sore throat.   Respiratory: Positive for cough. Negative for shortness of breath.   Cardiovascular: Negative for chest pain.  Gastrointestinal: Positive for diarrhea. Negative for abdominal pain, blood in stool, constipation, nausea and vomiting.  Genitourinary: Negative for dysuria.  Skin: Negative for rash.     Physical Exam Updated Vital Signs BP (!) 149/93 (BP Location: Right Arm)   Pulse 83   Temp 98.6 F (37 C) (Oral)   Resp 18   Ht 5\' 11"  (1.803 m)   Wt 131.5 kg (290 lb)   SpO2 96%   BMI 40.45 kg/m   Physical Exam  Constitutional: He is oriented to person, place, and time. He appears well-developed and well-nourished. No distress.  HENT:  Head: Normocephalic and atraumatic.  OP with erythema, no exudates or tonsillar hypertrophy. + nasal congestion with mucosal edema.   Neck: Normal range of motion. Neck supple.  No meningeal signs.   Cardiovascular: Normal rate, regular rhythm and normal heart sounds.   Pulmonary/Chest: Effort normal.  Lungs are clear to auscultation bilaterally - no w/r/r  Abdominal: Soft. He exhibits no distension. There is no tenderness.  Musculoskeletal: Normal range of motion.  Neurological: He is alert and oriented to person, place, and time.  Skin: Skin is warm and dry. He is not diaphoretic.  Nursing note and vitals reviewed.    ED Treatments / Results  Labs (all labs ordered are listed, but only abnormal results are displayed) Labs Reviewed - No data to display  EKG  EKG Interpretation None       Radiology No results found.  Procedures Procedures (including critical care time)  Medications Ordered in ED Medications - No data to display   Initial Impression / Assessment and Plan / ED Course  I have reviewed the triage vital signs and the nursing notes.  Pertinent labs & imaging results that were available during my care of the patient were reviewed by me and considered in my medical decision making (see  chart for details).    Hyland Mollenkopf is a 41 y.o. male who presents to ED for multiple symptoms c/w viral syndrome (fever, cough, congestion, diarrhea).   On exam, patient is afebrile, non-toxic appearing with a clear lung exam. Mild rhinorrhea and OP with erythema but no exudates or tonsillar hypertrophy. Benign abdominal exam.  Sxs today likely due to viral etiology.Symptomatic home care instructions discussed. Rx for cough suppressant given. PCP follow up strongly encouraged if symptoms persist. Reasons to return to ER discussed. All questions answered.   Blood pressure (!) 149/93, pulse 83, temperature 98.6 F (37 C), temperature source Oral, resp. rate 18, height 5\' 11"  (1.803 m), weight 131.5 kg (290 lb), SpO2 96 %.  Final Clinical Impressions(s) / ED Diagnoses   Final diagnoses:  Viral syndrome    New Prescriptions  Current Discharge Medication List       Benna Arno, Chase Picket, PA-C 07/26/17 1953    Clarene Duke Ambrose Finland, MD 07/27/17 2223

## 2017-12-20 ENCOUNTER — Encounter (HOSPITAL_BASED_OUTPATIENT_CLINIC_OR_DEPARTMENT_OTHER): Payer: Self-pay

## 2017-12-20 ENCOUNTER — Emergency Department (HOSPITAL_BASED_OUTPATIENT_CLINIC_OR_DEPARTMENT_OTHER): Payer: Self-pay

## 2017-12-20 ENCOUNTER — Emergency Department (HOSPITAL_BASED_OUTPATIENT_CLINIC_OR_DEPARTMENT_OTHER)
Admission: EM | Admit: 2017-12-20 | Discharge: 2017-12-21 | Disposition: A | Discharge status: Home / Self Care | Payer: Self-pay | Attending: Emergency Medicine | Admitting: Emergency Medicine

## 2017-12-20 DIAGNOSIS — Z7982 Long term (current) use of aspirin: Secondary | ICD-10-CM | POA: Insufficient documentation

## 2017-12-20 DIAGNOSIS — R202 Paresthesia of skin: Secondary | ICD-10-CM | POA: Insufficient documentation

## 2017-12-20 DIAGNOSIS — F17210 Nicotine dependence, cigarettes, uncomplicated: Secondary | ICD-10-CM | POA: Insufficient documentation

## 2017-12-20 DIAGNOSIS — E119 Type 2 diabetes mellitus without complications: Secondary | ICD-10-CM | POA: Insufficient documentation

## 2017-12-20 DIAGNOSIS — R209 Unspecified disturbances of skin sensation: Secondary | ICD-10-CM | POA: Insufficient documentation

## 2017-12-20 DIAGNOSIS — I10 Essential (primary) hypertension: Secondary | ICD-10-CM | POA: Insufficient documentation

## 2017-12-20 HISTORY — DX: Type 2 diabetes mellitus without complications: E11.9

## 2017-12-20 LAB — BASIC METABOLIC PANEL
Anion gap: 8 (ref 5–15)
BUN: 10 mg/dL (ref 6–20)
CO2: 26 mmol/L (ref 22–32)
Calcium: 9.1 mg/dL (ref 8.9–10.3)
Chloride: 104 mmol/L (ref 101–111)
Creatinine, Ser: 1.01 mg/dL (ref 0.61–1.24)
GFR calc Af Amer: >60 mL/min (ref >60)
GFR calc non Af Amer: >60 mL/min (ref >60)
Glucose, Bld: 130 mg/dL — ABNORMAL HIGH (ref 65–99)
Potassium: 3.7 mmol/L (ref 3.5–5.1)
Sodium: 138 mmol/L (ref 135–145)

## 2017-12-20 LAB — CBC WITH DIFFERENTIAL/PLATELET
Basophils Absolute: 0 10*3/uL (ref 0.0–0.1)
Basophils Relative: 1 %
Eosinophils Absolute: 0.4 10*3/uL (ref 0.0–0.7)
Eosinophils Relative: 5 %
HCT: 43.5 % (ref 39.0–52.0)
Hemoglobin: 14.5 g/dL (ref 13.0–17.0)
Lymphocytes Relative: 36 %
Lymphs Abs: 2.7 10*3/uL (ref 0.7–4.0)
MCH: 27.8 pg (ref 26.0–34.0)
MCHC: 33.3 g/dL (ref 30.0–36.0)
MCV: 83.3 fL (ref 78.0–100.0)
Monocytes Absolute: 0.7 10*3/uL (ref 0.1–1.0)
Monocytes Relative: 9 %
Neutro Abs: 3.7 10*3/uL (ref 1.7–7.7)
Neutrophils Relative %: 49 %
Platelets: 268 10*3/uL (ref 150–400)
RBC: 5.22 MIL/uL (ref 4.22–5.81)
RDW: 15.1 % (ref 11.5–15.5)
WBC: 7.5 10*3/uL (ref 4.0–10.5)

## 2017-12-20 LAB — CBG MONITORING, ED
Glucose-Capillary: 166 mg/dL — ABNORMAL HIGH (ref 65–99)
Glucose-Capillary: 92 mg/dL (ref 65–99)

## 2017-12-20 MED ORDER — LORAZEPAM 2 MG/ML IJ SOLN
0.5000 mg | Freq: Once | INTRAMUSCULAR | Status: AC
  Administered 2017-12-20: 0.5 mg via INTRAVENOUS

## 2017-12-20 MED ORDER — LORAZEPAM 2 MG/ML IJ SOLN
1.0000 mg | Freq: Once | INTRAMUSCULAR | Status: DC
Start: 2017-12-20 — End: 2017-12-20
  Filled 2017-12-20: qty 1

## 2017-12-20 NOTE — ED Provider Notes (Signed)
Patient here is transfer for numbness of the left face. MRI pending.   Arthor CaptainHarris, Melesio Madara, PA-C 12/20/17 2348    Linwood DibblesKnapp, Jon, MD 12/22/17 [phone]

## 2017-12-20 NOTE — ED Notes (Signed)
ED Provider at bedside. 

## 2017-12-20 NOTE — ED Provider Notes (Signed)
Medical screening examination/treatment/procedure(s) were conducted as a shared visit with non-physician practitioner(s) and myself.  I personally evaluated the patient during the encounter.   EKG Interpretation  Date/Time:  Wednesday December 20 2017 17:09:12 EST Ventricular Rate:  91 PR Interval:    QRS Duration: 98 QT Interval:  342 QTC Calculation: 421 R Axis:   -20 Text Interpretation:  Sinus rhythm Borderline left axis deviation Low voltage, precordial leads Consider anterior infarct Baseline wander in lead(s) V4 No significant change since last tracing Confirmed by Vanetta Mulders [phone]) on 12/20/2017 5:12:34 PM       Results for orders placed or performed during the hospital encounter of 12/20/17  Basic metabolic panel  Result Value Ref Range   Sodium 138 135 - 145 mmol/L   Potassium 3.7 3.5 - 5.1 mmol/L   Chloride 104 101 - 111 mmol/L   CO2 26 22 - 32 mmol/L   Glucose, Bld 130 (H) 65 - 99 mg/dL   BUN 10 6 - 20 mg/dL   Creatinine, Ser 6.04 0.61 - 1.24 mg/dL   Calcium 9.1 8.9 - 54.0 mg/dL   GFR calc non Af Amer >60 >60 mL/min   GFR calc Af Amer >60 >60 mL/min   Anion gap 8 5 - 15  CBC with Differential  Result Value Ref Range   WBC 7.5 4.0 - 10.5 K/uL   RBC 5.22 4.22 - 5.81 MIL/uL   Hemoglobin 14.5 13.0 - 17.0 g/dL   HCT 98.1 19.1 - 47.8 %   MCV 83.3 78.0 - 100.0 fL   MCH 27.8 26.0 - 34.0 pg   MCHC 33.3 30.0 - 36.0 g/dL   RDW 29.5 62.1 - 30.8 %   Platelets 268 150 - 400 K/uL   Neutrophils Relative % 49 %   Neutro Abs 3.7 1.7 - 7.7 K/uL   Lymphocytes Relative 36 %   Lymphs Abs 2.7 0.7 - 4.0 K/uL   Monocytes Relative 9 %   Monocytes Absolute 0.7 0.1 - 1.0 K/uL   Eosinophils Relative 5 %   Eosinophils Absolute 0.4 0.0 - 0.7 K/uL   Basophils Relative 1 %   Basophils Absolute 0.0 0.0 - 0.1 K/uL  POC CBG, ED  Result Value Ref Range   Glucose-Capillary 166 (H) 65 - 99 mg/dL   Ct Head Wo Contrast  Result Date: 12/20/2017 CLINICAL DATA:  Left-sided headaches for  several hours EXAM: CT HEAD WITHOUT CONTRAST TECHNIQUE: Contiguous axial images were obtained from the base of the skull through the vertex without intravenous contrast. COMPARISON:  03/29/2017 FINDINGS: Brain: No evidence of acute infarction, hemorrhage, hydrocephalus, extra-axial collection or mass lesion/mass effect. Vascular: No hyperdense vessel or unexpected calcification. Skull: Normal. Negative for fracture or focal lesion. Sinuses/Orbits: Mucosal retention cyst is noted in the left maxillary antrum. The orbits are within normal limits. Other: None. IMPRESSION: No acute intracranial abnormality noted. Electronically Signed   By: Alcide Clever M.D.   On: 12/20/2017 16:59     Patient seen by me along with the physician assistant.  Patient with known history of diabetes.  Also other risk factors include hypertension and obesity.  Patient yesterday was started on a oral hypoglycemic glipizide.  Had been on metformin prior to that.  And patient had sudden drop in blood sugar.  Went down into the 60s.  Patient followed his blood sugars and through the evening and early morning and noted that they stabilized.  Patient awoke this morning about 830 feeling like left hand was cold numbness  to left side of his face feeling like he was going to pass out.  Patient still with numbness to left face and even into the mouth.  Symptoms concerning for possible stroke.  Not sure if brought on by this sudden hypoglycemic episode.  Blood sugars are stabilized currently.  Patient head CT negative.  Patient will require transfer into [facility] for MRI.  If MRI negative patient stable for discharge home.  If positive will require admission for stroke.  Or other findings.  Patient without any other neuro deficits.  In addition patient will need to probably hold the glipizide because he had a fairly significant reaction.  Is possible that the numbness could be a side effect of the glipizide.   Vanetta MuldersZackowski, Wasyl Dornfeld, MD 12/20/17  [phone]

## 2017-12-20 NOTE — ED Notes (Signed)
[facility] for R/O stroke. Pt woke up this AM with L facial numbness (chin/mouth area). Pt states it feels like he received numbing medicine from the dentist. Pt also reports his L hand feels "cold" Pt switched from Metformin to Glipizide yesterday and they are considering possible medication rxn R/T pt CBG dropping earlier in the day.

## 2017-12-20 NOTE — ED Provider Notes (Signed)
[facility] H/o DM Woke this morning with left facial numbness Here for MRI  Plan - neg can be discharged  Patient is found to be asymptomatic on recheck. Appears comfortable. MRI reviewed and is negative for acute finding. He can be discharged home with plan to see his doctor for recheck this week. [facility]. Security to give him a ride back to his car.   Elpidio AnisUpstill, Neng Albee, PA-C 12/21/17 0230    Linwood DibblesKnapp, Jon, MD 12/22/17 [phone]

## 2017-12-20 NOTE — ED Notes (Signed)
[facility]

## 2017-12-20 NOTE — ED Triage Notes (Signed)
C/o left side facial numbness that he woke with 830am-later feeling like left hand "is cold"-states he had hot flashes and feeling like he was going to pass out with BS changes yesterday-PCP stopped metformin and started glipizide yesterday-pt NAD-steady gait

## 2017-12-20 NOTE — ED Provider Notes (Signed)
[facility] EMERGENCY DEPARTMENT Provider Note   CSN: [id number] Arrival date & time: 12/20/17  1603     History   Chief Complaint Chief Complaint  Patient presents with  . Numbness    HPI Dean Hunt is a 42 y.o. male with history of diabetes, hypertension, obesity, anxiety, depression who presents with a 8-hour history of facial paresthesia and numbness since intermittent cold sensation in his left hand.  Patient reports he switched from metformin to glipizide for his diabetes yesterday and after taking the first pill, had an episode of diaphoresis and lightheadedness.  His blood sugar dropped to 62 and remained low, around 100, throughout the night despite eating.  Patient woke up this morning with the paresthesias and numbness.  He also has a strange sensation with chewing.  He denies any vision changes or weakness.  He reports he has had a slight headache for the past 2 days.  He denies any chest pain, shortness of breath, abdominal pain, nausea, vomiting.  He has also had some postnasal drip for the past few days.  He feels like his allergies may be returning.  Patient is a current smoker.  HPI  Past Medical History:  Diagnosis Date  . Anxiety   . Depression   . Diabetes mellitus without complication (HCC)   . History of methamphetamine use   . Hypertension   . Obsessive-compulsive disorder     There are no active problems to display for this patient.   Past Surgical History:  Procedure Laterality Date  . TONSILLECTOMY    . WISDOM TOOTH EXTRACTION         Home Medications    Prior to Admission medications   Medication Sig Start Date End Date Taking? Authorizing Provider  ALPRAZolam (XANAX XR) 2 MG 24 hr tablet Take 2 mg by mouth every morning.   Yes [provider]  ALPRAZolam Prudy Feeler) 1 MG tablet Take 1 mg by mouth at bedtime as needed for anxiety.   Yes [provider]  aspirin EC 81 MG tablet Take 81 mg by mouth daily.   Yes  [provider]  glipiZIDE (GLUCOTROL) 5 MG tablet Take by mouth daily before breakfast.   Yes [provider]  metoprolol succinate (TOPROL-XL) 25 MG 24 hr tablet Take 25 mg by mouth daily.    [provider]    Family History No family history on file.  Social History Social History   Tobacco Use  . Smoking status: Current Every Day Smoker    Packs/day: 0.50    Types: Cigarettes  . Smokeless tobacco: Never Used  Substance Use Topics  . Alcohol use: Yes    Comment: social   . Drug use: No     Allergies   Compazine [prochlorperazine edisylate] and Penicillins   Review of Systems Review of Systems  Constitutional: Negative for chills and fever.  HENT: Negative for facial swelling and sore throat.   Respiratory: Negative for shortness of breath.   Cardiovascular: Negative for chest pain.  Gastrointestinal: Negative for abdominal pain, nausea and vomiting.  Genitourinary: Negative for dysuria.  Musculoskeletal: Negative for back pain.  Skin: Negative for rash and wound.  Neurological: Positive for light-headedness, numbness (paresthesia) and headaches. Negative for speech difficulty and weakness.  Psychiatric/Behavioral: The patient is not nervous/anxious.      Physical Exam Updated Vital Signs BP (!) 159/109   Pulse 88   Temp 98.6 F (37 C)   Resp 16   Ht 5\' 11"  (  1.803 m)   Wt (!) 137 kg (302 lb)   SpO2 99%   BMI 42.12 kg/m   Physical Exam  Constitutional: He appears well-developed and well-nourished. No distress.  HENT:  Head: Normocephalic and atraumatic.  Mouth/Throat: Oropharynx is clear and moist. No oropharyngeal exudate.  Eyes: Conjunctivae and EOM are normal. Pupils are equal, round, and reactive to light. Right eye exhibits no discharge. Left eye exhibits no discharge. No scleral icterus.  Neck: Normal range of motion. Neck supple. No thyromegaly present.  Cardiovascular: Normal rate, regular rhythm, normal heart sounds  and intact distal pulses. Exam reveals no gallop and no friction rub.  No murmur heard. Pulmonary/Chest: Effort normal and breath sounds normal. No stridor. No respiratory distress. He has no wheezes. He has no rales.  Abdominal: Soft. Bowel sounds are normal. He exhibits no distension. There is no tenderness. There is no rebound and no guarding.  Musculoskeletal: He exhibits no edema.  Lymphadenopathy:    He has no cervical adenopathy.  Neurological: He is alert. Coordination normal.  CN 3-12 intact; paresthesia with light touch to left side of face, otherwise normal sensation throughout; 5/5 strength in all 4 extremities; equal bilateral grip strength; normal heel to shin test; no ataxia on finger to nose  Skin: Skin is warm and dry. No rash noted. He is not diaphoretic. No pallor.  Psychiatric: He has a normal mood and affect.  Nursing note and vitals reviewed.    ED Treatments / Results  Labs (all labs ordered are listed, but only abnormal results are displayed) Labs Reviewed  BASIC METABOLIC PANEL - Abnormal; Notable for the following components:      Result Value   Glucose, Bld 130 (*)    All other components within normal limits  CBG MONITORING, ED - Abnormal; Notable for the following components:   Glucose-Capillary 166 (*)    All other components within normal limits  CBC WITH DIFFERENTIAL/PLATELET    EKG  EKG Interpretation  Date/Time:  Wednesday December 20 2017 17:09:12 EST Ventricular Rate:  91 PR Interval:    QRS Duration: 98 QT Interval:  342 QTC Calculation: 421 R Axis:   -20 Text Interpretation:  Sinus rhythm Borderline left axis deviation Low voltage, precordial leads Consider anterior infarct Baseline wander in lead(s) V4 No significant change since last tracing Confirmed by Vanetta Mulders [id number] on 12/20/2017 5:12:34 PM       Radiology Ct Head Wo Contrast  Result Date: 12/20/2017 CLINICAL DATA:  Left-sided headaches for several hours EXAM: CT HEAD  WITHOUT CONTRAST TECHNIQUE: Contiguous axial images were obtained from the base of the skull through the vertex without intravenous contrast. COMPARISON:  03/29/2017 FINDINGS: Brain: No evidence of acute infarction, hemorrhage, hydrocephalus, extra-axial collection or mass lesion/mass effect. Vascular: No hyperdense vessel or unexpected calcification. Skull: Normal. Negative for fracture or focal lesion. Sinuses/Orbits: Mucosal retention cyst is noted in the left maxillary antrum. The orbits are within normal limits. Other: None. IMPRESSION: No acute intracranial abnormality noted. Electronically Signed   By: Alcide Clever M.D.   On: 12/20/2017 16:59    Procedures Procedures (including critical care time)  Medications Ordered in ED Medications  LORazepam (ATIVAN) injection 0.5 mg (0.5 mg Intravenous Given 12/20/17 1941)     Initial Impression / Assessment and Plan / ED Course  I have reviewed the triage vital signs and the nursing notes.  Pertinent labs & imaging results that were available during my care of the patient were reviewed by me and considered  in my medical decision making (see chart for details).     Patient with persistent paresthesia to left side of face since 830 this morning.  Patient reports it feels like Novocain shot and has symptoms when he is chewing as well.  Otherwise normal neuro exam.  CT head and labs are unremarkable here.  However, considering patient's several risk factors and episode of hypoglycemia last evening and persistent symptoms, myself and Dr. Deretha EmoryZackowski feel patient should [facility] to rule out stroke.  Patient is in agreement.  I spoke with [facility] Madily[facility] at [facility] who is aware of patient's arrival.  Patient may have solely medication reaction and I feel patient would be safe for discharge if MRI is negative.  Final Clinical Impressions(s) / ED Diagnoses   Final diagnoses:  Paresthesia    ED Discharge Orders    None         Emi HolesLaw, Caitriona Sundquist M, PA-C 12/20/17 Salome Holmes1957    Zackowski, Scott, MD 12/20/17 2345    Vanetta MuldersZackowski, Scott, MD 12/20/17 2348

## 2017-12-20 NOTE — ED Notes (Signed)
Patient transported to CT 

## 2017-12-21 ENCOUNTER — Emergency Department (HOSPITAL_COMMUNITY): Payer: Self-pay

## 2018-04-15 ENCOUNTER — Emergency Department (HOSPITAL_BASED_OUTPATIENT_CLINIC_OR_DEPARTMENT_OTHER): Payer: Self-pay

## 2018-04-15 ENCOUNTER — Encounter (HOSPITAL_BASED_OUTPATIENT_CLINIC_OR_DEPARTMENT_OTHER): Payer: Self-pay | Admitting: Emergency Medicine

## 2018-04-15 ENCOUNTER — Emergency Department (HOSPITAL_BASED_OUTPATIENT_CLINIC_OR_DEPARTMENT_OTHER)
Admission: EM | Admit: 2018-04-15 | Discharge: 2018-04-15 | Disposition: A | Discharge status: Home / Self Care | Payer: Self-pay | Attending: Emergency Medicine | Admitting: Emergency Medicine

## 2018-04-15 ENCOUNTER — Other Ambulatory Visit: Payer: Self-pay

## 2018-04-15 DIAGNOSIS — Z79899 Other long term (current) drug therapy: Secondary | ICD-10-CM | POA: Insufficient documentation

## 2018-04-15 DIAGNOSIS — E119 Type 2 diabetes mellitus without complications: Secondary | ICD-10-CM | POA: Insufficient documentation

## 2018-04-15 DIAGNOSIS — Z7982 Long term (current) use of aspirin: Secondary | ICD-10-CM | POA: Insufficient documentation

## 2018-04-15 DIAGNOSIS — R1011 Right upper quadrant pain: Secondary | ICD-10-CM | POA: Insufficient documentation

## 2018-04-15 DIAGNOSIS — I10 Essential (primary) hypertension: Secondary | ICD-10-CM | POA: Insufficient documentation

## 2018-04-15 DIAGNOSIS — F17210 Nicotine dependence, cigarettes, uncomplicated: Secondary | ICD-10-CM | POA: Insufficient documentation

## 2018-04-15 DIAGNOSIS — Z7984 Long term (current) use of oral hypoglycemic drugs: Secondary | ICD-10-CM | POA: Insufficient documentation

## 2018-04-15 LAB — URINALYSIS, ROUTINE W REFLEX MICROSCOPIC
Bilirubin Urine: NEGATIVE
Glucose, UA: NEGATIVE mg/dL
Hgb urine dipstick: NEGATIVE
Ketones, ur: 15 mg/dL — AB
Leukocytes, UA: NEGATIVE
Nitrite: NEGATIVE
Protein, ur: NEGATIVE mg/dL
Specific Gravity, Urine: 1.025 (ref 1.005–1.030)
pH: 6 (ref 5.0–8.0)

## 2018-04-15 LAB — CBC WITH DIFFERENTIAL/PLATELET
Basophils Absolute: 0 10*3/uL (ref 0.0–0.1)
Basophils Relative: 1 %
Eosinophils Absolute: 0.4 10*3/uL (ref 0.0–0.7)
Eosinophils Relative: 4 %
HCT: 43.2 % (ref 39.0–52.0)
Hemoglobin: 14.9 g/dL (ref 13.0–17.0)
Lymphocytes Relative: 40 %
Lymphs Abs: 3.5 10*3/uL (ref 0.7–4.0)
MCH: 28.4 pg (ref 26.0–34.0)
MCHC: 34.5 g/dL (ref 30.0–36.0)
MCV: 82.4 fL (ref 78.0–100.0)
Monocytes Absolute: 0.9 10*3/uL (ref 0.1–1.0)
Monocytes Relative: 11 %
Neutro Abs: 4 10*3/uL (ref 1.7–7.7)
Neutrophils Relative %: 44 %
Platelets: 293 10*3/uL (ref 150–400)
RBC: 5.24 MIL/uL (ref 4.22–5.81)
RDW: 14.9 % (ref 11.5–15.5)
WBC: 8.8 10*3/uL (ref 4.0–10.5)

## 2018-04-15 LAB — COMPREHENSIVE METABOLIC PANEL
ALT: 19 U/L (ref 17–63)
AST: 20 U/L (ref 15–41)
Albumin: 3.9 g/dL (ref 3.5–5.0)
Alkaline Phosphatase: 104 U/L (ref 38–126)
Anion gap: 9 (ref 5–15)
BUN: 13 mg/dL (ref 6–20)
CO2: 23 mmol/L (ref 22–32)
Calcium: 9.1 mg/dL (ref 8.9–10.3)
Chloride: 108 mmol/L (ref 101–111)
Creatinine, Ser: 0.95 mg/dL (ref 0.61–1.24)
GFR calc Af Amer: >60 mL/min (ref >60)
GFR calc non Af Amer: >60 mL/min (ref >60)
Glucose, Bld: 123 mg/dL — ABNORMAL HIGH (ref 65–99)
Potassium: 3.6 mmol/L (ref 3.5–5.1)
Sodium: 140 mmol/L (ref 135–145)
Total Bilirubin: 0.3 mg/dL (ref 0.3–1.2)
Total Protein: 7.7 g/dL (ref 6.5–8.1)

## 2018-04-15 LAB — LIPASE, BLOOD: Lipase: 25 U/L (ref 11–51)

## 2018-04-15 MED ORDER — ONDANSETRON HCL 4 MG/2ML IJ SOLN
4.0000 mg | Freq: Once | INTRAMUSCULAR | Status: AC
Start: 2018-04-15 — End: 2018-04-15
  Administered 2018-04-15: 4 mg via INTRAVENOUS
  Filled 2018-04-15: qty 2

## 2018-04-15 MED ORDER — ONDANSETRON HCL 4 MG PO TABS: 4.0000 mg | ORAL_TABLET | Freq: Four times a day (QID) | ORAL | 0 refills | Status: AC

## 2018-04-15 MED ORDER — FAMOTIDINE 20 MG PO TABS: 20.0000 mg | ORAL_TABLET | Freq: Two times a day (BID) | ORAL | 0 refills | Status: AC

## 2018-04-15 MED ORDER — HYDROCORTISONE 2.5 % RE CREA: TOPICAL_CREAM | RECTAL | 0 refills | Status: AC

## 2018-04-15 MED ORDER — SODIUM CHLORIDE 0.9 % IV BOLUS
1000.0000 mL | Freq: Once | INTRAVENOUS | Status: AC
Start: 2018-04-15 — End: 2018-04-15
  Administered 2018-04-15: 1000 mL via INTRAVENOUS

## 2018-04-15 MED ORDER — KETOROLAC TROMETHAMINE 30 MG/ML IJ SOLN
15.0000 mg | Freq: Once | INTRAMUSCULAR | Status: AC
  Administered 2018-04-15: 15 mg via INTRAVENOUS
  Filled 2018-04-15: qty 1

## 2018-04-15 NOTE — Discharge Instructions (Addendum)
Medications: Zofran, Pepcid, Anusol HC  Treatment: Take Zofran every 6 hours as needed for nausea or vomiting.  Take Pepcid twice daily as prescribed.  Try to keep track of foods that might make your abdominal pain worse.  Apply either Preparation H or Anusol HC to your hemorrhoid.  Follow-up: Please follow-up with gastroenterology for further evaluation and treatment of your symptoms.  You may need further testing of your gallbladder.  If you cannot be seen by gastroenterology within the next week, please follow-up with your  primary care provider for recheck.  Please return to the emergency department if you develop any new or worsening symptoms including fever, severe, worsening pain, intractable vomiting, or any other new or concerning symptom.

## 2018-04-15 NOTE — ED Provider Notes (Signed)
[facility] EMERGENCY DEPARTMENT Provider Note   CSN: [id number] Arrival date & time: 04/15/18  1542     History   Chief Complaint Chief Complaint  Patient presents with  . Abdominal Pain    HPI Dean Hunt is a 42 y.o. male with history of diabetes, depression, anxiety, hypertension who presents with a one-month history of right upper quadrant pain.  Patient reports his pain is intermittent, worse at night after eating.  He describes it as burning.  His pain wraps around to his right flank.  He has had associated decreased appetite and nausea.  He denies any fever.  He reports he has noticed he is urinating a little more frequently.  He denies any diarrhea.  His bowel movements have been normal.  He does report a hemorrhoid that has been recurrent, but improves with Preparation H.  He is taking Imodium, MiraLAX, Pepcid, and Zofran at home without significant relief of his pain.  HPI  Past Medical History:  Diagnosis Date  . Anxiety   . Depression   . Diabetes mellitus without complication (HCC)   . History of methamphetamine use   . Hypertension   . Obsessive-compulsive disorder     There are no active problems to display for this patient.   Past Surgical History:  Procedure Laterality Date  . TONSILLECTOMY    . WISDOM TOOTH EXTRACTION          Home Medications    Prior to Admission medications   Medication Sig Start Date End Date Taking? Authorizing Provider  ALPRAZolam (XANAX XR) 2 MG 24 hr tablet Take 2 mg by mouth every morning.    [provider]  ALPRAZolam Prudy Feeler) 1 MG tablet Take 1 mg by mouth at bedtime as needed for anxiety.    [provider]  aspirin EC 81 MG tablet Take 81 mg by mouth daily.    [provider]  famotidine (PEPCID) 20 MG tablet Take 1 tablet (20 mg total) by mouth 2 (two) times daily. 04/15/18   Puneet Selden, Waylan Boga, PA-C  glipiZIDE (GLUCOTROL) 5 MG tablet Take by mouth daily before breakfast.    [provider]  hydrocortisone (ANUSOL-HC) 2.5 % rectal cream Apply rectally 2 times daily 04/15/18   Mckay Brandt, Waylan Boga, PA-C  metoprolol succinate (TOPROL-XL) 25 MG 24 hr tablet Take 25 mg by mouth daily.    [provider]  ondansetron (ZOFRAN) 4 MG tablet Take 1 tablet (4 mg total) by mouth every 6 (six) hours. 04/15/18   Emi Holes, PA-C    Family History History reviewed. No pertinent family history.  Social History Social History   Tobacco Use  . Smoking status: Current Every Day Smoker    Packs/day: 0.50    Types: Cigarettes  . Smokeless tobacco: Never Used  Substance Use Topics  . Alcohol use: Yes    Comment: social   . Drug use: No     Allergies   Compazine [prochlorperazine edisylate] and Penicillins   Review of Systems Review of Systems  Constitutional: Positive for appetite change. Negative for chills and fever.  HENT: Negative for facial swelling and sore throat.   Respiratory: Negative for shortness of breath.   Cardiovascular: Negative for chest pain.  Gastrointestinal: Positive for abdominal pain and nausea. Negative for anal bleeding, blood in stool and vomiting.  Genitourinary: Negative for dysuria.  Musculoskeletal: Negative for back pain.  Skin: Negative for rash and wound.  Neurological: Negative for headaches.  Psychiatric/Behavioral: The  patient is not nervous/anxious.      Physical Exam Updated Vital Signs BP (!) 142/92 (BP Location: Left Arm)   Pulse 93   Temp 98.5 F (36.9 C) (Oral)   Resp 18   Ht  (1.803 m)   Wt 135.2 kg (298 lb)   SpO2 98%   BMI 41.56 kg/m   Physical Exam  Constitutional: He appears well-developed and well-nourished. No distress.  HENT:  Head: Normocephalic and atraumatic.  Mouth/Throat: Oropharynx is clear and moist. No oropharyngeal exudate.  Eyes: Pupils are equal, round, and reactive to light. Conjunctivae are normal. Right eye exhibits no discharge. Left eye exhibits no discharge. No  scleral icterus.  Neck: Normal range of motion. Neck supple. No thyromegaly present.  Cardiovascular: Normal rate, regular rhythm, normal heart sounds and intact distal pulses. Exam reveals no gallop and no friction rub.  No murmur heard. Pulmonary/Chest: Effort normal and breath sounds normal. No stridor. No respiratory distress. He has no wheezes. He has no rales.  Abdominal: Soft. Bowel sounds are normal. He exhibits no distension. There is tenderness in the right upper quadrant. There is positive Murphy's sign. There is no rebound, no guarding and no CVA tenderness.  Genitourinary: Rectal exam shows external hemorrhoid (<1 cm, R lateral, external).  Musculoskeletal: He exhibits no edema.  Lymphadenopathy:    He has no cervical adenopathy.  Neurological: He is alert. Coordination normal.  Skin: Skin is warm and dry. No rash noted. He is not diaphoretic. No pallor.  Psychiatric: He has a normal mood and affect.  Nursing note and vitals reviewed.    ED Treatments / Results  Labs (all labs ordered are listed, but only abnormal results are displayed) Labs Reviewed  COMPREHENSIVE METABOLIC PANEL - Abnormal; Notable for the following components:      Result Value   Glucose, Bld 123 (*)    All other components within normal limits  URINALYSIS, ROUTINE W REFLEX MICROSCOPIC - Abnormal; Notable for the following components:   Ketones, ur 15 (*)    All other components within normal limits  LIPASE, BLOOD  CBC WITH DIFFERENTIAL/PLATELET    EKG None  Radiology US Abdomen Limited Ruq  Result Date: 04/15/2018 CLINICAL DATA:  Right upper quadrant pain for 1 month EXAM: ULTRASOUND ABDOMEN LIMITED RIGHT UPPER QUADRANT COMPARISON:  None. FINDINGS: Gallbladder: No gallstones or wall thickening visualized. No sonographic Murphy sign noted by sonographer. Common bile duct: Diameter: 3 mm Liver: No focal lesion identified. Increased hepatic parenchymal echogenicity. Portal vein is patent on color  Doppler imaging with normal direction of blood flow towards the liver. IMPRESSION: 1. No cholelithiasis or sonographic evidence of acute cholecystitis. 2. Low attenuation of the liver as can be seen with hepatic steatosis. Electronically Signed   By: Elige Ko   On: 04/15/2018 16:43    Procedures Procedures (including critical care time)  Medications Ordered in ED Medications  sodium chloride 0.9 % bolus 1,000 mL (0 mLs Intravenous Stopped 04/15/18 1825)  ondansetron (ZOFRAN) injection 4 mg (4 mg Intravenous Given 04/15/18 1658)  ketorolac (TORADOL) 30 MG/ML injection 15 mg (15 mg Intravenous Given 04/15/18 1659)     Initial Impression / Assessment and Plan / ED Course  I have reviewed the triage vital signs and the nursing notes.  Pertinent labs & imaging results that were available during my care of the patient were reviewed by me and considered in my medical decision making (see chart for details).     Patient presenting with right  upper quadrant pain.  It is intermittent.  He describes it as burning.  Right upper quadrant ultrasound is negative for cholelithiasis or acute cholecystitis today.  Patient is afebrile.  He is well-appearing.  No indication for emergent CT for further work-up today.  CBC, CMP, lipase unremarkable.  UA shows 15 ketones.  Patient given liter fluids in the ED.  Will refer to gastroenterology for further evaluation and possibly HIDA scan.  Patient advised to keep track foods that make his pain worse.  Will treat with Zofran and Pepcid.  Patient also with hemorrhoid, which will be treated with Anusol-HC.  Patient advised follow-up with PCP if unable to follow-up with GI in the next week.  Strict return precautions given.  Patient understands and agrees with plan.  Patient vitals stable throughout ED course and discharged in satisfactory condition.  Final Clinical Impressions(s) / ED Diagnoses   Final diagnoses:  RUQ pain  Right upper quadrant abdominal pain     ED Discharge Orders        Ordered    famotidine (PEPCID) 20 MG tablet  2 times daily     04/15/18 1820    ondansetron (ZOFRAN) 4 MG tablet  Every 6 hours     04/15/18 1820    hydrocortisone (ANUSOL-HC) 2.5 % rectal cream     04/15/18 [address] PA-C 04/15/18 1845    Arby Barrette, MD 04/23/18 [phone]

## 2018-04-15 NOTE — ED Triage Notes (Signed)
Patient states that he is having right sided upper abdominal pain  - the patient also states that he has a hemorrhoids and nausea x 1 week. The patient states that he does not feel like eating.

## 2018-04-15 NOTE — ED Notes (Signed)
Patient transported to Ultrasound 

## 2018-04-29 ENCOUNTER — Other Ambulatory Visit: Payer: Self-pay

## 2018-04-29 ENCOUNTER — Emergency Department (HOSPITAL_BASED_OUTPATIENT_CLINIC_OR_DEPARTMENT_OTHER): Payer: Self-pay

## 2018-04-29 ENCOUNTER — Encounter (HOSPITAL_BASED_OUTPATIENT_CLINIC_OR_DEPARTMENT_OTHER): Payer: Self-pay | Admitting: Emergency Medicine

## 2018-04-29 ENCOUNTER — Emergency Department (HOSPITAL_BASED_OUTPATIENT_CLINIC_OR_DEPARTMENT_OTHER)
Admission: EM | Admit: 2018-04-29 | Discharge: 2018-04-29 | Disposition: A | Discharge status: Home / Self Care | Payer: Self-pay | Attending: Emergency Medicine | Admitting: Emergency Medicine

## 2018-04-29 DIAGNOSIS — I10 Essential (primary) hypertension: Secondary | ICD-10-CM | POA: Insufficient documentation

## 2018-04-29 DIAGNOSIS — Z7982 Long term (current) use of aspirin: Secondary | ICD-10-CM | POA: Insufficient documentation

## 2018-04-29 DIAGNOSIS — E119 Type 2 diabetes mellitus without complications: Secondary | ICD-10-CM | POA: Insufficient documentation

## 2018-04-29 DIAGNOSIS — Z79899 Other long term (current) drug therapy: Secondary | ICD-10-CM | POA: Insufficient documentation

## 2018-04-29 DIAGNOSIS — F17210 Nicotine dependence, cigarettes, uncomplicated: Secondary | ICD-10-CM | POA: Insufficient documentation

## 2018-04-29 DIAGNOSIS — F419 Anxiety disorder, unspecified: Secondary | ICD-10-CM | POA: Insufficient documentation

## 2018-04-29 DIAGNOSIS — Z7984 Long term (current) use of oral hypoglycemic drugs: Secondary | ICD-10-CM | POA: Insufficient documentation

## 2018-04-29 LAB — TROPONIN I: Troponin I: <0.03 ng/mL (ref <0.03)

## 2018-04-29 LAB — CBC
HEMATOCRIT: 42.2 % (ref 39.0–52.0)
HEMOGLOBIN: 14.3 g/dL (ref 13.0–17.0)
MCH: 27.9 pg (ref 26.0–34.0)
MCHC: 33.9 g/dL (ref 30.0–36.0)
MCV: 82.3 fL (ref 78.0–100.0)
Platelets: 256 10*3/uL (ref 150–400)
RBC: 5.13 MIL/uL (ref 4.22–5.81)
RDW: 14.5 % (ref 11.5–15.5)
WBC: 7.6 10*3/uL (ref 4.0–10.5)

## 2018-04-29 LAB — BASIC METABOLIC PANEL
Anion gap: 8 (ref 5–15)
BUN: 12 mg/dL (ref 6–20)
CHLORIDE: 104 mmol/L (ref 101–111)
CO2: 26 mmol/L (ref 22–32)
CREATININE: 0.93 mg/dL (ref 0.61–1.24)
Calcium: 8.9 mg/dL (ref 8.9–10.3)
GFR calc Af Amer: >60 mL/min (ref >60)
GFR calc non Af Amer: >60 mL/min (ref >60)
GLUCOSE: 105 mg/dL — AB (ref 65–99)
POTASSIUM: 3.6 mmol/L (ref 3.5–5.1)
SODIUM: 138 mmol/L (ref 135–145)

## 2018-04-29 MED ORDER — ASPIRIN 81 MG PO CHEW
324.0000 mg | CHEWABLE_TABLET | Freq: Once | ORAL | Status: AC
  Administered 2018-04-29: 324 mg via ORAL
  Filled 2018-04-29: qty 4

## 2018-04-29 NOTE — ED Triage Notes (Addendum)
Patient states that he was out of town and took someone else's BP medication but is unsure what he took - he was told it was the same as his. The patient reports that he is not taking his medications because he is concerned and he states that he is very anxious - patient states that he took half of BP pill this am and his XR xanax

## 2018-04-29 NOTE — ED Provider Notes (Signed)
[facility] EMERGENCY DEPARTMENT Provider Note   CSN: [id number] Arrival date & time: 04/29/18  1319     History   Chief Complaint Chief Complaint  Patient presents with  . Hypertension    HPI Dean Hunt is a 42 y.o. male.  HPI Patient presents to the emergency room for evaluation of chest discomfort, and shortness of breath.  Patient states he has a history of anxiety and panic attacks.  Patient is normally well controlled and has not had a panic attack in few years.  Patient did have some anxiety yesterday when he traveled out of town and did not take his blood pressure medications.  He ended up taking someone else's.  He started feeling worse after tha.  yesterday had an episode where he felt suddenly short of breath and had some chest discomfort.  This was very similar to his previous panic attacks.  He tried taking some of his Xanax with some improvement.  She states he had some pressure-like discomfort in his chest and felt diaphoretic.  Patient does not have a history of heart disease and had previous negative stress to tests but he was concerned and wanted to get checked out. Past Medical History:  Diagnosis Date  . Anxiety   . Depression   . Diabetes mellitus without complication (HCC)   . History of methamphetamine use   . Hypertension   . Obsessive-compulsive disorder     There are no active problems to display for this patient.   Past Surgical History:  Procedure Laterality Date  . TONSILLECTOMY    . WISDOM TOOTH EXTRACTION          Home Medications    Prior to Admission medications   Medication Sig Start Date End Date Taking? Authorizing Provider  ALPRAZolam (XANAX XR) 2 MG 24 hr tablet Take 2 mg by mouth every morning.    [provider]  ALPRAZolam Prudy Feeler(XANAX) 1 MG tablet Take 1 mg by mouth at bedtime as needed for anxiety.    [provider]  aspirin EC 81 MG tablet Take 81 mg by mouth daily.    [provider]    famotidine (PEPCID) 20 MG tablet Take 1 tablet (20 mg total) by mouth 2 (two) times daily. 04/15/18   Law, Waylan BogaAlexandra M, PA-C  glipiZIDE (GLUCOTROL) 5 MG tablet Take by mouth daily before breakfast.    [provider]  hydrocortisone (ANUSOL-HC) 2.5 % rectal cream Apply rectally 2 times daily 04/15/18   Law, Waylan BogaAlexandra M, PA-C  metoprolol succinate (TOPROL-XL) 25 MG 24 hr tablet Take 25 mg by mouth daily.    [provider]  ondansetron (ZOFRAN) 4 MG tablet Take 1 tablet (4 mg total) by mouth every 6 (six) hours. 04/15/18   Emi HolesLaw, Alexandra M, PA-C    Family History History reviewed. No pertinent family history.  Social History Social History   Tobacco Use  . Smoking status: Current Every Day Smoker    Packs/day: 0.50    Types: Cigarettes  . Smokeless tobacco: Never Used  Substance Use Topics  . Alcohol use: Yes    Comment: social   . Drug use: No     Allergies   Compazine [prochlorperazine edisylate] and Penicillins   Review of Systems Review of Systems  All other systems reviewed and are negative.    Physical Exam Updated Vital Signs BP 123/89   Pulse 75   Temp 99.2 F (37.3 C) (Oral)   Resp 16   Ht 1.803  m (5\' 11" )   Wt 135.2 kg (298 lb)   SpO2 98%   BMI 41.56 kg/m   Physical Exam  Constitutional: He appears well-developed and well-nourished. No distress.  HENT:  Head: Normocephalic and atraumatic.  Right Ear: External ear normal.  Left Ear: External ear normal.  Eyes: Conjunctivae are normal. Right eye exhibits no discharge. Left eye exhibits no discharge. No scleral icterus.  Neck: Neck supple. No tracheal deviation present.  Cardiovascular: Normal rate, regular rhythm and intact distal pulses.  Pulmonary/Chest: Effort normal and breath sounds normal. No stridor. No respiratory distress. He has no wheezes. He has no rales.  Abdominal: Soft. Bowel sounds are normal. He exhibits no distension. There is no tenderness. There is no rebound and no  guarding.  Musculoskeletal: He exhibits no edema or tenderness.  Mild area of erythema and excoriation right forearm, suggestive of insect bite, no abscess  Neurological: He is alert. He has normal strength. No cranial nerve deficit (no facial droop, extraocular movements intact, no slurred speech) or sensory deficit. He exhibits normal muscle tone. He displays no seizure activity. Coordination normal.  Skin: Skin is warm and dry. No rash noted.  Psychiatric: He has a normal mood and affect.  Nursing note and vitals reviewed.    ED Treatments / Results  Labs (all labs ordered are listed, but only abnormal results are displayed) Labs Reviewed  BASIC METABOLIC PANEL - Abnormal; Notable for the following components:      Result Value   Glucose, Bld 105 (*)    All other components within normal limits  TROPONIN I  CBC  TROPONIN I    EKG EKG Interpretation  Date/Time:  Sunday April 29 2018 13:25:49 EDT Ventricular Rate:  98 PR Interval:  136 QRS Duration: 94 QT Interval:  350 QTC Calculation: 446 R Axis:   1 Text Interpretation:  Normal sinus rhythm Cannot rule out Anterior infarct , age undetermined Abnormal ECG No significant change since last tracing Confirmed by Linwood Dibbles [phone]) on 04/29/2018 1:33:38 PM   Radiology Dg Chest 2 View  Result Date: 04/29/2018 CLINICAL DATA:  Chest pain since yesterday EXAM: CHEST - 2 VIEW COMPARISON:  January 17, 2018 FINDINGS: The heart size and mediastinal contours are within normal limits. There is no focal infiltrate, pulmonary edema, or pleural effusion. The visualized skeletal structures are unremarkable. IMPRESSION: No active cardiopulmonary disease. Electronically Signed   By: Sherian Rein M.D.   On: 04/29/2018 15:27    Procedures Procedures (including critical care time)  Medications Ordered in ED Medications  aspirin chewable tablet 324 mg (324 mg Oral Given 04/29/18 1534)     Initial Impression / Assessment and Plan / ED Course    I have reviewed the triage vital signs and the nursing notes.  Pertinent labs & imaging results that were available during my care of the patient were reviewed by me and considered in my medical decision making (see chart for details).   Presented to the emergency room for evaluation of chest discomfort after an anxiety attack.  Patient does have a history of hypertension diabetes but no history of heart disease.  Patient's EKG laboratory tests and x-rays are reassuring.  Recommend doing a second troponin however the patient states he is feeling better and is ready to go home.  He does not want to wait for a second troponin.  He understands return for any worsening symptoms.  Otherwise he will follow-up with his primary doctor and his psychiatrist.   Final  Clinical Impressions(s) / ED Diagnoses   Final diagnoses:  Essential hypertension  Anxiety    ED Discharge Orders    None       Linwood Dibbles, MD 04/29/18 [phone]

## 2018-04-29 NOTE — ED Notes (Signed)
Patient transported to X-ray 

## 2018-04-29 NOTE — ED Notes (Signed)
Pt has hx of anxiety, reports he has not had a panic attack in 2 years prior to today. Pt states on his way to a friends house he had chest pain and felt like he could not breath. Pt also reports episode of diarrhea x 2 days, denies fevers. Pt also complaining of abcess-like red knot to right arm. Area red and swollen.

## 2018-04-29 NOTE — Discharge Instructions (Signed)
Continue your current medications. Follow-up with your primary care doctor. Return as needed for worsening symptoms °

## 2018-07-02 ENCOUNTER — Other Ambulatory Visit: Payer: Self-pay

## 2018-07-02 ENCOUNTER — Emergency Department (HOSPITAL_BASED_OUTPATIENT_CLINIC_OR_DEPARTMENT_OTHER)
Admission: EM | Admit: 2018-07-02 | Discharge: 2018-07-02 | Disposition: A | Discharge status: Home / Self Care | Payer: Self-pay | Attending: Emergency Medicine | Admitting: Emergency Medicine

## 2018-07-02 ENCOUNTER — Encounter (HOSPITAL_BASED_OUTPATIENT_CLINIC_OR_DEPARTMENT_OTHER): Payer: Self-pay

## 2018-07-02 ENCOUNTER — Emergency Department (HOSPITAL_BASED_OUTPATIENT_CLINIC_OR_DEPARTMENT_OTHER): Payer: Self-pay

## 2018-07-02 DIAGNOSIS — S76312A Strain of muscle, fascia and tendon of the posterior muscle group at thigh level, left thigh, initial encounter: Secondary | ICD-10-CM | POA: Insufficient documentation

## 2018-07-02 DIAGNOSIS — I10 Essential (primary) hypertension: Secondary | ICD-10-CM | POA: Insufficient documentation

## 2018-07-02 DIAGNOSIS — R0789 Other chest pain: Secondary | ICD-10-CM | POA: Insufficient documentation

## 2018-07-02 DIAGNOSIS — H8111 Benign paroxysmal vertigo, right ear: Secondary | ICD-10-CM | POA: Insufficient documentation

## 2018-07-02 DIAGNOSIS — X58XXXA Exposure to other specified factors, initial encounter: Secondary | ICD-10-CM | POA: Insufficient documentation

## 2018-07-02 DIAGNOSIS — Y999 Unspecified external cause status: Secondary | ICD-10-CM | POA: Insufficient documentation

## 2018-07-02 DIAGNOSIS — E119 Type 2 diabetes mellitus without complications: Secondary | ICD-10-CM | POA: Insufficient documentation

## 2018-07-02 DIAGNOSIS — Z79899 Other long term (current) drug therapy: Secondary | ICD-10-CM | POA: Insufficient documentation

## 2018-07-02 DIAGNOSIS — F17210 Nicotine dependence, cigarettes, uncomplicated: Secondary | ICD-10-CM | POA: Insufficient documentation

## 2018-07-02 DIAGNOSIS — R519 Headache, unspecified: Secondary | ICD-10-CM

## 2018-07-02 DIAGNOSIS — Z7982 Long term (current) use of aspirin: Secondary | ICD-10-CM | POA: Insufficient documentation

## 2018-07-02 DIAGNOSIS — Y939 Activity, unspecified: Secondary | ICD-10-CM | POA: Insufficient documentation

## 2018-07-02 DIAGNOSIS — Z7984 Long term (current) use of oral hypoglycemic drugs: Secondary | ICD-10-CM | POA: Insufficient documentation

## 2018-07-02 DIAGNOSIS — Y929 Unspecified place or not applicable: Secondary | ICD-10-CM | POA: Insufficient documentation

## 2018-07-02 DIAGNOSIS — R51 Headache: Secondary | ICD-10-CM | POA: Insufficient documentation

## 2018-07-02 LAB — BASIC METABOLIC PANEL
ANION GAP: 7 (ref 5–15)
BUN: 9 mg/dL (ref 6–20)
CALCIUM: 8.8 mg/dL — AB (ref 8.9–10.3)
CO2: 27 mmol/L (ref 22–32)
Chloride: 105 mmol/L (ref 98–111)
Creatinine, Ser: 0.89 mg/dL (ref 0.61–1.24)
GFR calc non Af Amer: >60 mL/min (ref >60)
Glucose, Bld: 131 mg/dL — ABNORMAL HIGH (ref 70–99)
POTASSIUM: 3.6 mmol/L (ref 3.5–5.1)
Sodium: 139 mmol/L (ref 135–145)

## 2018-07-02 LAB — CBC
HEMATOCRIT: 42.9 % (ref 39.0–52.0)
HEMOGLOBIN: 14.4 g/dL (ref 13.0–17.0)
MCH: 27.9 pg (ref 26.0–34.0)
MCHC: 33.6 g/dL (ref 30.0–36.0)
MCV: 83 fL (ref 78.0–100.0)
Platelets: 268 10*3/uL (ref 150–400)
RBC: 5.17 MIL/uL (ref 4.22–5.81)
RDW: 14.9 % (ref 11.5–15.5)
WBC: 7.3 10*3/uL (ref 4.0–10.5)

## 2018-07-02 LAB — TROPONIN I

## 2018-07-02 MED ORDER — GI COCKTAIL ~~LOC~~
30.0000 mL | Freq: Once | ORAL | Status: AC
Start: 2018-07-02 — End: 2018-07-02
  Administered 2018-07-02: 30 mL via ORAL
  Filled 2018-07-02: qty 30

## 2018-07-02 MED ORDER — ONDANSETRON HCL 4 MG/2ML IJ SOLN
4.00 | INTRAMUSCULAR | Status: DC
Start: ? — End: 2018-07-02

## 2018-07-02 MED ORDER — SODIUM CHLORIDE 0.9 % IJ SOLN
5.00 | INTRAMUSCULAR | Status: DC
Start: 2018-07-02 — End: 2018-07-02

## 2018-07-02 MED ORDER — NITROGLYCERIN 0.4 MG SL SUBL
0.40 | SUBLINGUAL_TABLET | SUBLINGUAL | Status: DC
Start: ? — End: 2018-07-02

## 2018-07-02 MED ORDER — METOCLOPRAMIDE HCL 5 MG/ML IJ SOLN
10.0000 mg | Freq: Once | INTRAMUSCULAR | Status: AC
  Administered 2018-07-02: 10 mg via INTRAVENOUS
  Filled 2018-07-02: qty 2

## 2018-07-02 MED ORDER — SODIUM CHLORIDE 0.9 % IJ SOLN
5.00 | INTRAMUSCULAR | Status: DC
Start: ? — End: 2018-07-02

## 2018-07-02 MED ORDER — MECLIZINE HCL 25 MG PO TABS
25.0000 mg | ORAL_TABLET | Freq: Three times a day (TID) | ORAL | 0 refills | Status: AC | PRN
Start: 2018-07-02 — End: ?

## 2018-07-02 MED ORDER — DIPHENHYDRAMINE HCL 50 MG/ML IJ SOLN
25.0000 mg | Freq: Once | INTRAMUSCULAR | Status: AC
  Administered 2018-07-02: 25 mg via INTRAVENOUS
  Filled 2018-07-02: qty 1

## 2018-07-02 MED ORDER — MORPHINE SULFATE 4 MG/ML IJ SOLN
4.00 | INTRAMUSCULAR | Status: DC
Start: ? — End: 2018-07-02

## 2018-07-02 MED ORDER — DEXAMETHASONE 6 MG PO TABS
10.0000 mg | ORAL_TABLET | Freq: Once | ORAL | Status: AC
  Administered 2018-07-02: 10 mg via ORAL
  Filled 2018-07-02: qty 1

## 2018-07-02 NOTE — ED Triage Notes (Signed)
Pt presents to ED via [facility] with increased dizziness and chest pain. [facility] and d/c after cardiac workup. Pt had morphine, nitro and ativan at [facility]. Pt also had ultrasound workup to left leg due to pain. Pt appears to be in NAD. .Marland Kitchen

## 2018-07-02 NOTE — ED Notes (Signed)
ED Provider at bedside. 

## 2018-07-02 NOTE — ED Provider Notes (Signed)
[facility] EMERGENCY DEPARTMENT Provider Note   CSN: [id number] Arrival date & time: 07/02/18  1802     History   Chief Complaint Chief Complaint  Patient presents with  . Dizziness    HPI Dean Hunt is a 42 y.o. male.   42 yo M with a chief complaint of chest pain dizziness and leg pain.  Symptoms have all come on at different times.  He has been having the dizziness for the past couple days and has been having the chest pain for the past for 5 days and the leg pain for the past week or so.  He is not sure what makes any of them worse.  Later than says it makes his dizziness worse to turn his head.  He is better when he holds still.  Is having a left-sided headache.  Does not recall any headaches like it previously.  Denies any unilateral numbness or weakness denies any difficulty swallowing or speech.  Chest pain feels like pressure goes from the right side of the chest to the left.  Nothing seems to make this better or worse.  Does not seem to be exertional.  No shortness of breath.  He feels that he has to breathe heavy to catch his breath.  Left leg pain is worse with movement and palpation.  Denies trauma.  Denies cough congestion or fever.  Denies hemoptysis.  Denies unilateral lower extremity edema.  Denies history of PE or DVT.  Denies testosterone use.  Denies recent surgery or immobilization.  Denies blunt trauma.  Patient denies history of MI.  Has a history of hypertension hyperlipidemia and diabetes.  Mom had an MI in her 5850s.  He is also a current smoker.  The history is provided by the patient.  Dizziness  Quality:  Head spinning Severity:  Moderate Onset quality:  Sudden Duration:  2 days Timing:  Constant Progression:  Worsening Chronicity:  New Context: head movement   Relieved by:  Being still Worsened by:  Movement and turning head Ineffective treatments:  None tried Associated symptoms: chest pain   Associated symptoms: no diarrhea, no headaches, no  palpitations, no shortness of breath and no vomiting     Past Medical History:  Diagnosis Date  . Anxiety   . Depression   . Diabetes mellitus without complication (HCC)   . History of methamphetamine use   . Hypertension   . Obsessive-compulsive disorder     There are no active problems to display for this patient.   Past Surgical History:  Procedure Laterality Date  . TONSILLECTOMY    . WISDOM TOOTH EXTRACTION          Home Medications    Prior to Admission medications   Medication Sig Start Date End Date Taking? Authorizing Provider  famotidine (PEPCID) 20 MG tablet Take 1 tablet (20 mg total) by mouth 2 (two) times daily. 04/15/18  Yes Law, Alexandra M, PA-C  glipiZIDE (GLUCOTROL) 5 MG tablet Take by mouth daily before breakfast.   Yes [provider]  hydrocortisone (ANUSOL-HC) 2.5 % rectal cream Apply rectally 2 times daily 04/15/18  Yes Law, Alexandra M, PA-C  Vitamin D, Ergocalciferol, (DRISDOL) 50000 units CAPS capsule Take 50,000 Units by mouth every 7 (seven) days.   Yes [provider]  ALPRAZolam (XANAX XR) 2 MG 24 hr tablet Take 2 mg by mouth every morning.    [provider]  ALPRAZolam Prudy Feeler(XANAX) 1 MG tablet Take 1 mg by mouth at bedtime  as needed for anxiety.    [provider]  aspirin EC 81 MG tablet Take 81 mg by mouth daily.    [provider]  metoprolol succinate (TOPROL-XL) 25 MG 24 hr tablet Take 25 mg by mouth daily.    [provider]  ondansetron (ZOFRAN) 4 MG tablet Take 1 tablet (4 mg total) by mouth every 6 (six) hours. 04/15/18   Emi HolesLaw, Alexandra M, PA-C    Family History No family history on file.  Social History Social History   Tobacco Use  . Smoking status: Current Every Day Smoker    Packs/day: 0.50    Types: Cigarettes  . Smokeless tobacco: Never Used  Substance Use Topics  . Alcohol use: Yes    Comment: social   . Drug use: No     Allergies   Compazine [prochlorperazine  edisylate] and Penicillins   Review of Systems Review of Systems  Constitutional: Negative for chills and fever.  HENT: Negative for congestion and facial swelling.   Eyes: Negative for discharge and visual disturbance.  Respiratory: Negative for shortness of breath.   Cardiovascular: Positive for chest pain. Negative for palpitations.  Gastrointestinal: Negative for abdominal pain, diarrhea and vomiting.  Musculoskeletal: Negative for arthralgias and myalgias.  Skin: Negative for color change and rash.  Neurological: Positive for dizziness. Negative for tremors, syncope and headaches.  Psychiatric/Behavioral: Negative for confusion and dysphoric mood.     Physical Exam Updated Vital Signs BP (!) 159/95 (BP Location: Right Arm)   Pulse 85   Temp 98.7 F (37.1 C) (Oral)   Resp 14   Ht 5\' 11"  (1.803 m)   Wt 136.1 kg   SpO2 99%   BMI 41.84 kg/m   Physical Exam  Constitutional: He is oriented to person, place, and time. He appears well-developed and well-nourished.  HENT:  Head: Normocephalic and atraumatic.  Eyes: Pupils are equal, round, and reactive to light. EOM are normal.  Neck: Normal range of motion. Neck supple. No JVD present.  Cardiovascular: Normal rate and regular rhythm. Exam reveals no gallop and no friction rub.  No murmur heard. Pulmonary/Chest: No respiratory distress. He has no wheezes.  Abdominal: He exhibits no distension. There is no rebound and no guarding.  Musculoskeletal: Normal range of motion.  Neurological: He is alert and oriented to person, place, and time. He has normal strength. No cranial nerve deficit or sensory deficit. He displays a negative Romberg sign. Coordination normal. GCS eye subscore is 4. GCS verbal subscore is 5. GCS motor subscore is 6.  Right fast going nystagmus.  Otherwise benign neuro exam  Skin: No rash noted. No pallor.  Psychiatric: He has a normal mood and affect. His behavior is normal.  Nursing note and vitals  reviewed.    ED Treatments / Results  Labs (all labs ordered are listed, but only abnormal results are displayed) Labs Reviewed  BASIC METABOLIC PANEL - Abnormal; Notable for the following components:      Result Value   Glucose, Bld 131 (*)    Calcium 8.8 (*)    All other components within normal limits  CBC  TROPONIN I    EKG EKG Interpretation  Date/Time:  Monday July 02 2018 18:09:51 EDT Ventricular Rate:  86 PR Interval:    QRS Duration: 99 QT Interval:  347 QTC Calculation: 415 R Axis:   -18 Text Interpretation:  Sinus rhythm Borderline left axis deviation No significant change since last tracing Confirmed by Melene PlanFloyd, Ambers Iyengar [phone] on  07/02/2018 6:19:02 PM   Radiology No results found.  Procedures Procedures (including critical care time) Discussed smoking cessation with patient and was they were offerred resources to help stop.  Total time was 5 min CPT code 16109.   Medications Ordered in ED Medications  metoCLOPramide (REGLAN) injection 10 mg (has no administration in time range)  diphenhydrAMINE (BENADRYL) injection 25 mg (has no administration in time range)  dexamethasone (DECADRON) tablet 10 mg (has no administration in time range)  gi cocktail (Maalox,Lidocaine,Donnatal) (has no administration in time range)     Initial Impression / Assessment and Plan / ED Course  I have reviewed the triage vital signs and the nursing notes.  Pertinent labs & imaging results that were available during my care of the patient were reviewed by me and considered in my medical decision making (see chart for details).     42 yo M with multiple complaints.  Patient's dizziness sounds like BPPV clinically.  He has right-sided fast going nystagmus.  No other noted neurologic defects.  Right TM is normal.  Ambulates without difficulty.  Chest pain is atypical in nature.  Was just seen less than 6 hours ago with a negative delta troponin and unremarkable EKG chest x-ray and basic  labs.  He had repeat labs ordered in triage.  I do not feel he needs another delta.  He is PERC negative.   We will give a headache cocktail.  Give a GI cocktail.  Reassess.  Patient is feeling much better after both cocktails.  His dizziness is resolved.  Troponin repeated here is negative.  I discussed the results with him, will have him follow-up with his PCP.  7:55 PM:  I have discussed the diagnosis/risks/treatment options with the patient and friend and believe the pt to be eligible for discharge home to follow-up with PCP. We also discussed returning to the ED immediately if new or worsening sx occur. We discussed the sx which are most concerning (e.g., sudden worsening pain, fever, inability to tolerate by mouth) that necessitate immediate return. Medications administered to the patient during their visit and any new prescriptions provided to the patient are listed below.  Medications given during this visit Medications  metoCLOPramide (REGLAN) injection 10 mg (10 mg Intravenous Given 07/02/18 1904)  diphenhydrAMINE (BENADRYL) injection 25 mg (25 mg Intravenous Given 07/02/18 1849)  dexamethasone (DECADRON) tablet 10 mg (10 mg Oral Given 07/02/18 1849)  gi cocktail (Maalox,Lidocaine,Donnatal) (30 mLs Oral Given 07/02/18 1849)      The patient appears reasonably screen and/or stabilized for discharge and I doubt any other medical condition or other [facility] requiring further screening, evaluation, or treatment in the ED at this time prior to discharge.    Final Clinical Impressions(s) / ED Diagnoses   Final diagnoses:  None    ED Discharge Orders    None       Melene Plan, DO 07/02/18 1955

## 2018-07-02 NOTE — Discharge Instructions (Signed)
Try to avoid spicy foods, tomato based products, chocolate, fatty foods.  Alcohol and tobacco can make this worse.

## 2018-12-12 ENCOUNTER — Emergency Department (HOSPITAL_BASED_OUTPATIENT_CLINIC_OR_DEPARTMENT_OTHER)
Admission: EM | Admit: 2018-12-12 | Discharge: 2018-12-12 | Disposition: A | Discharge status: Home / Self Care | Payer: Self-pay | Attending: Emergency Medicine | Admitting: Emergency Medicine

## 2018-12-12 ENCOUNTER — Encounter (HOSPITAL_BASED_OUTPATIENT_CLINIC_OR_DEPARTMENT_OTHER): Payer: Self-pay

## 2018-12-12 ENCOUNTER — Other Ambulatory Visit: Payer: Self-pay

## 2018-12-12 ENCOUNTER — Emergency Department (HOSPITAL_BASED_OUTPATIENT_CLINIC_OR_DEPARTMENT_OTHER): Payer: Self-pay

## 2018-12-12 DIAGNOSIS — M545 Low back pain, unspecified: Secondary | ICD-10-CM

## 2018-12-12 DIAGNOSIS — Z79899 Other long term (current) drug therapy: Secondary | ICD-10-CM | POA: Insufficient documentation

## 2018-12-12 DIAGNOSIS — Z7984 Long term (current) use of oral hypoglycemic drugs: Secondary | ICD-10-CM | POA: Insufficient documentation

## 2018-12-12 DIAGNOSIS — E119 Type 2 diabetes mellitus without complications: Secondary | ICD-10-CM | POA: Insufficient documentation

## 2018-12-12 DIAGNOSIS — I10 Essential (primary) hypertension: Secondary | ICD-10-CM | POA: Insufficient documentation

## 2018-12-12 DIAGNOSIS — K5732 Diverticulitis of large intestine without perforation or abscess without bleeding: Secondary | ICD-10-CM | POA: Insufficient documentation

## 2018-12-12 DIAGNOSIS — F17210 Nicotine dependence, cigarettes, uncomplicated: Secondary | ICD-10-CM | POA: Insufficient documentation

## 2018-12-12 DIAGNOSIS — Z7982 Long term (current) use of aspirin: Secondary | ICD-10-CM | POA: Insufficient documentation

## 2018-12-12 LAB — COMPREHENSIVE METABOLIC PANEL
ALBUMIN: 4 g/dL (ref 3.5–5.0)
ALK PHOS: 107 U/L (ref 38–126)
ALT: 23 U/L (ref 0–44)
ANION GAP: 6 (ref 5–15)
AST: 18 U/L (ref 15–41)
BUN: 13 mg/dL (ref 6–20)
CALCIUM: 9 mg/dL (ref 8.9–10.3)
CHLORIDE: 104 mmol/L (ref 98–111)
CO2: 25 mmol/L (ref 22–32)
Creatinine, Ser: 0.95 mg/dL (ref 0.61–1.24)
GFR calc Af Amer: >60 mL/min (ref >60)
GFR calc non Af Amer: >60 mL/min (ref >60)
Glucose, Bld: 150 mg/dL — ABNORMAL HIGH (ref 70–99)
POTASSIUM: 3.7 mmol/L (ref 3.5–5.1)
Sodium: 135 mmol/L (ref 135–145)
Total Bilirubin: 0.1 mg/dL — ABNORMAL LOW (ref 0.3–1.2)
Total Protein: 7.7 g/dL (ref 6.5–8.1)

## 2018-12-12 LAB — CBC WITH DIFFERENTIAL/PLATELET
ABS IMMATURE GRANULOCYTES: 0.04 10*3/uL (ref 0.00–0.07)
BASOS ABS: 0.1 10*3/uL (ref 0.0–0.1)
Basophils Relative: 1 %
EOS ABS: 0.3 10*3/uL (ref 0.0–0.5)
Eosinophils Relative: 4 %
HEMATOCRIT: 44.8 % (ref 39.0–52.0)
HEMOGLOBIN: 14 g/dL (ref 13.0–17.0)
IMMATURE GRANULOCYTES: 1 %
LYMPHS ABS: 3.8 10*3/uL (ref 0.7–4.0)
LYMPHS PCT: 44 %
MCH: 26.8 pg (ref 26.0–34.0)
MCHC: 31.3 g/dL (ref 30.0–36.0)
MCV: 85.8 fL (ref 80.0–100.0)
Monocytes Absolute: 0.8 10*3/uL (ref 0.1–1.0)
Monocytes Relative: 9 %
NEUTROS ABS: 3.4 10*3/uL (ref 1.7–7.7)
NEUTROS PCT: 41 %
Platelets: 306 10*3/uL (ref 150–400)
RBC: 5.22 MIL/uL (ref 4.22–5.81)
RDW: 14 % (ref 11.5–15.5)
WBC: 8.3 10*3/uL (ref 4.0–10.5)
nRBC: 0 % (ref 0.0–0.2)

## 2018-12-12 LAB — LIPASE, BLOOD: Lipase: 28 U/L (ref 11–51)

## 2018-12-12 MED ORDER — IOPAMIDOL (ISOVUE-300) INJECTION 61%
100.0000 mL | Freq: Once | INTRAVENOUS | Status: AC | PRN
  Administered 2018-12-12: 100 mL via INTRAVENOUS

## 2018-12-12 MED ORDER — DICYCLOMINE HCL 20 MG PO TABS: 20.0000 mg | ORAL_TABLET | Freq: Two times a day (BID) | ORAL | 0 refills | Status: AC

## 2018-12-12 MED ORDER — ONDANSETRON HCL 4 MG/2ML IJ SOLN
4.0000 mg | Freq: Once | INTRAMUSCULAR | Status: AC
Start: 2018-12-12 — End: 2018-12-12
  Administered 2018-12-12: 4 mg via INTRAVENOUS
  Filled 2018-12-12: qty 2

## 2018-12-12 MED ORDER — ONDANSETRON 4 MG PO TBDP: 4.0000 mg | ORAL_TABLET | Freq: Three times a day (TID) | ORAL | 0 refills | Status: DC | PRN

## 2018-12-12 MED ORDER — LIDOCAINE 5 % EX PTCH: 1.0000 | MEDICATED_PATCH | CUTANEOUS | 0 refills | Status: AC

## 2018-12-12 MED ORDER — METHOCARBAMOL 500 MG PO TABS: 500.0000 mg | ORAL_TABLET | Freq: Two times a day (BID) | ORAL | 0 refills | Status: AC

## 2018-12-12 MED ORDER — CIPROFLOXACIN HCL 500 MG PO TABS: 500.0000 mg | ORAL_TABLET | Freq: Two times a day (BID) | ORAL | 0 refills | Status: AC

## 2018-12-12 MED ORDER — MORPHINE SULFATE (PF) 4 MG/ML IV SOLN
4.0000 mg | Freq: Once | INTRAVENOUS | Status: AC
  Administered 2018-12-12: 4 mg via INTRAVENOUS
  Filled 2018-12-12: qty 1

## 2018-12-12 MED ORDER — METRONIDAZOLE 500 MG PO TABS: 500.0000 mg | ORAL_TABLET | Freq: Two times a day (BID) | ORAL | 0 refills | Status: AC

## 2018-12-12 NOTE — Discharge Instructions (Addendum)
There is evidence of what is called diverticulitis on the CT scan.  Please take all of your antibiotics until finished!   You may develop abdominal discomfort or diarrhea from the antibiotic.  You may help offset this with probiotics which you can buy or get in yogurt. Do not eat or take the probiotics until 2 hours after your antibiotic.   Please note: The ciprofloxacin has a small chance of decreasing your blood sugar.  Please be sure to monitor your blood sugar carefully.  Hand washing: Wash your hands throughout the day, but especially before and after touching the face, using the restroom, sneezing, coughing, or touching surfaces that have been coughed or sneezed upon. Hydration: Symptoms will be intensified and complicated by dehydration. Dehydration can also extend the duration of symptoms. Drink plenty of fluids and get plenty of rest. You should be drinking at least half a liter of water an hour to stay hydrated. Electrolyte drinks (ex. Gatorade, Powerade, Pedialyte) are also encouraged. You should be drinking enough fluids to make your urine light yellow, almost clear. If this is not the case, you are not drinking enough water. Please note that some of the treatments indicated below will not be effective if you are not adequately hydrated. Diet: Please concentrate on hydration, however, you may introduce food slowly.  Start with a clear liquid diet, progressed to a full liquid diet, and then bland solids as you are able.  At the core of preventing future flares of diverticulitis is avoiding constipation and having regular bowel movements.  This is best accomplished by staying well-hydrated and initiating a high-fiber diet. Pain or fever: Ibuprofen, Naproxen, or Tylenol for pain.  Bentyl: This medication is what is known as an antispasmodic and is intended to help reduce abdominal discomfort. Zofran: May take the Zofran, as needed for nausea/vomiting. Follow-up: Follow-up with a primary care  provider or gastroenterology on this matter.  Return: Return should you develop a fever, increased amount/frequency of bloody diarrhea, increased abdominal pain, uncontrolled vomiting, passing out, dizziness, or any other major concerns.  For prescription assistance, may try using prescription discount sites or apps, such as goodrx.com  Back pain: Take it easy, but do not lay around too much as this may make any stiffness worse.  Antiinflammatory medications: Take 600 mg of ibuprofen every 6 hours or 440 mg (over the counter dose) to 500 mg (prescription dose) of naproxen every 12 hours for the next 3 days. After this time, these medications may be used as needed for pain. Take these medications with food to avoid upset stomach. Choose only one of these medications, do not take them together. Acetaminophen (generic for Tylenol): Should you continue to have additional pain while taking the ibuprofen or naproxen, you may add in acetaminophen as needed. Your daily total maximum amount of acetaminophen from all sources should be limited to 4000mg /day for persons without liver problems, or 2000mg /day for those with liver problems. Muscle relaxer: Robaxin is a muscle relaxer and may help loosen stiff muscles. Do not take the Robaxin while driving or performing other dangerous activities.  Lidocaine patches: These are available via either prescription or over-the-counter. The over-the-counter option may be more economical one and are likely just as effective. There are multiple over-the-counter brands, such as Salonpas. Exercises: Be sure to perform the attached exercises starting with three times a week and working up to performing them daily. This is an essential part of preventing long term problems.  Follow up: Follow up with  a primary care provider for any future management of these complaints. Be sure to follow up within 7-10 days. Return: Return to the ED should symptoms worsen.  For prescription  assistance, may try using prescription discount sites or apps, such as goodrx.com

## 2018-12-12 NOTE — ED Triage Notes (Signed)
C/o lower back pain x 1-2 weeks-abd pain x 2 days-nausea-denies v/d-NAD-steady gait

## 2018-12-12 NOTE — ED Provider Notes (Signed)
[facility] EMERGENCY DEPARTMENT Provider Note   CSN: [id number] Arrival date & time: 12/12/18  1428     History   Chief Complaint Chief Complaint  Patient presents with  . Back Pain    HPI Dean Hunt is a 43 y.o. male.  HPI   Dean Hunt is a 43 y.o. male, with a history of anxiety, depression, DM, and HTN, presenting to the ED with abdominal pain beginning 2 days ago.  Pain is in the lower abdomen, intermittent, sharp, nonradiating, moderate to severe. Accompanied by bloating and nausea.  Patient also notes he began having lower back pain about 10 days ago. Woke up in the morning with some pain in the back, worse when he sat up. Central, lower back, "feels like someone has kicked me," constant, worse with movement, currently 7/10.  Last BM was last night and was normal.  He has been intermittently taking NSAIDS, but only for the past few days. Drinks a couple alcoholic drinks every other week. Smokes 4-5 cigarettes daily. Denies illicit drug use.  Denies fever/chills, vomiting, diarrhea, constipation, falls/trauma, weakness, numbness, hematochezia/melena, urinary symptoms, changes in bowel or bladder function, saddle anesthesias, chest pain, shortness of breath, or any other complaints.    Past Medical History:  Diagnosis Date  . Anxiety   . Depression   . Diabetes mellitus without complication (HCC)   . History of methamphetamine use   . Hypertension   . Obsessive-compulsive disorder     There are no active problems to display for this patient.   Past Surgical History:  Procedure Laterality Date  . TONSILLECTOMY    . WISDOM TOOTH EXTRACTION          Home Medications    Prior to Admission medications   Medication Sig Start Date End Date Taking? Authorizing Provider  ALPRAZolam (XANAX XR) 2 MG 24 hr tablet Take 2 mg by mouth at bedtime.    Yes [provider]  amLODipine (NORVASC) 5 MG tablet Take 5 mg by mouth daily.   Yes [provider]  aspirin EC 81 MG tablet Take 81 mg by mouth daily.   Yes [provider]  METOPROLOL SUCCINATE ER PO Take 25 mg by mouth 2 (two) times daily.    Yes [provider]  omeprazole (PRILOSEC) 20 MG capsule Take 20 mg by mouth daily.   Yes [provider]  ALPRAZolam Prudy Feeler) 1 MG tablet Take 1 mg by mouth at bedtime as needed for anxiety.    [provider]  ciprofloxacin (CIPRO) 500 MG tablet Take 1 tablet (500 mg total) by mouth 2 (two) times daily. 12/12/18   Joy, Shawn C, PA-C  dicyclomine (BENTYL) 20 MG tablet Take 1 tablet (20 mg total) by mouth 2 (two) times daily. 12/12/18   Joy, Shawn C, PA-C  famotidine (PEPCID) 20 MG tablet Take 1 tablet (20 mg total) by mouth 2 (two) times daily. 04/15/18   Law, Waylan Boga, PA-C  glipiZIDE (GLUCOTROL) 5 MG tablet Take by mouth daily before breakfast.    [provider]  hydrocortisone (ANUSOL-HC) 2.5 % rectal cream Apply rectally 2 times daily 04/15/18   Law, Waylan Boga, PA-C  lidocaine (LIDODERM) 5 % Place 1 patch onto the skin daily. Remove & Discard patch within 12 hours or as directed by MD 12/12/18   Anselm Pancoast, PA-C  meclizine (ANTIVERT) 25 MG tablet Take 1 tablet (25 mg total) by mouth 3 (three) times daily as needed for dizziness. 07/02/18  Melene PlanFloyd, Dan, DO  methocarbamol (ROBAXIN) 500 MG tablet Take 1 tablet (500 mg total) by mouth 2 (two) times daily. 12/12/18   Joy, Shawn C, PA-C  metroNIDAZOLE (FLAGYL) 500 MG tablet Take 1 tablet (500 mg total) by mouth 2 (two) times daily. 12/12/18   Joy, Shawn C, PA-C  ondansetron (ZOFRAN ODT) 4 MG disintegrating tablet Take 1 tablet (4 mg total) by mouth every 8 (eight) hours as needed for nausea or vomiting. 12/12/18   Joy, Shawn C, PA-C  ondansetron (ZOFRAN) 4 MG tablet Take 1 tablet (4 mg total) by mouth every 6 (six) hours. 04/15/18   Law, Waylan BogaAlexandra M, PA-C  Vitamin D, Ergocalciferol, (DRISDOL) 50000 units CAPS capsule Take 50,000 Units by mouth every 7  (seven) days.    [provider]    Family History No family history on file.  Social History Social History   Tobacco Use  . Smoking status: Current Every Day Smoker    Packs/day: 0.50    Types: Cigarettes  . Smokeless tobacco: Never Used  Substance Use Topics  . Alcohol use: Yes    Comment: occ  . Drug use: No     Allergies   Compazine [prochlorperazine edisylate] and Penicillins   Review of Systems Review of Systems  Constitutional: Negative for chills, diaphoresis and fever.  Respiratory: Negative for shortness of breath.   Cardiovascular: Negative for chest pain and leg swelling.  Gastrointestinal: Positive for abdominal pain and nausea. Negative for blood in stool, constipation, diarrhea and vomiting.  Genitourinary: Negative for dysuria, flank pain, frequency, hematuria, scrotal swelling and testicular pain.  Musculoskeletal: Positive for back pain.  All other systems reviewed and are negative.    Physical Exam Updated Vital Signs BP (!) 142/98 (BP Location: Left Arm)   Pulse 97   Temp 98.3 F (36.8 C) (Oral)   Resp 18   Ht 5\' 11"  (1.803 m)   Wt (!) 138.8 kg   SpO2 98%   BMI 42.68 kg/m   Physical Exam Vitals signs and nursing note reviewed.  Constitutional:      General: He is not in acute distress.    Appearance: He is well-developed. He is obese. He is not diaphoretic.  HENT:     Head: Normocephalic and atraumatic.     Mouth/Throat:     Mouth: Mucous membranes are moist.     Pharynx: Oropharynx is clear.  Eyes:     Conjunctiva/sclera: Conjunctivae normal.  Neck:     Musculoskeletal: Neck supple.  Cardiovascular:     Rate and Rhythm: Normal rate and regular rhythm.     Pulses: Normal pulses.          Radial pulses are 2+ on the right side and 2+ on the left side.       Posterior tibial pulses are 2+ on the right side and 2+ on the left side.     Heart sounds: Normal heart sounds.  Pulmonary:     Effort: Pulmonary effort is  normal. No respiratory distress.     Breath sounds: Normal breath sounds.  Abdominal:     General: Bowel sounds are normal.     Palpations: Abdomen is soft.     Tenderness: There is abdominal tenderness. There is no guarding.    Musculoskeletal:       Back:     Right lower leg: No edema.     Left lower leg: No edema.     Comments: Tenderness in the lower back, as shown.  No noted swelling, deformity, step-off.   Lymphadenopathy:     Cervical: No cervical adenopathy.  Skin:    General: Skin is warm and dry.  Neurological:     Mental Status: He is alert.     Comments: Sensation grossly intact to light touch in the lower extremities bilaterally. No saddle anesthesias. Strength 5/5 in the bilateral lower extremities. No noted gait deficit. Coordination intact with heel to shin testing.  Psychiatric:        Mood and Affect: Mood and affect normal.        Speech: Speech normal.        Behavior: Behavior normal.      ED Treatments / Results  Labs (all labs ordered are listed, but only abnormal results are displayed) Labs Reviewed  COMPREHENSIVE METABOLIC PANEL - Abnormal; Notable for the following components:      Result Value   Glucose, Bld 150 (*)    Total Bilirubin 0.1 (*)    All other components within normal limits  CBC WITH DIFFERENTIAL/PLATELET  LIPASE, BLOOD  URINALYSIS, ROUTINE W REFLEX MICROSCOPIC    EKG EKG Interpretation  Date/Time:  Wednesday December 12 2018 16:22:49 EST Ventricular Rate:  82 PR Interval:    QRS Duration: 98 QT Interval:  358 QTC Calculation: 419 R Axis:   -8 Text Interpretation:  Sinus rhythm Low voltage, precordial leads No significant change since last tracing Confirmed by Alvira MondaySchlossman, Erin ([id number]) on 12/12/2018 6:56:20 PM   Radiology Ct Abdomen Pelvis W Contrast  Result Date: 12/12/2018 CLINICAL DATA:  Acute lower abdominal pain and diabetes and tenderness, hypertension EXAM: CT ABDOMEN AND PELVIS WITH CONTRAST TECHNIQUE:  Multidetector CT imaging of the abdomen and pelvis was performed using the standard protocol following bolus administration of intravenous contrast. CONTRAST:  100mL ISOVUE-300 IOPAMIDOL (ISOVUE-300) INJECTION 61% COMPARISON:  07/03/2017 FINDINGS: Lower chest: Minor basilar atelectasis. Normal heart size. No pericardial effusion. Minor lower thoracic degenerative change. Hepatobiliary: No focal liver abnormality is seen. No gallstones, gallbladder wall thickening, or biliary dilatation. Pancreas: Unremarkable. No pancreatic ductal dilatation or surrounding inflammatory changes. Spleen: Normal in size without focal abnormality. Adrenals/Urinary Tract: Adrenal glands are unremarkable. Kidneys are normal, without renal calculi, focal lesion, or hydronephrosis. Bladder is unremarkable. Stomach/Bowel: Negative for bowel obstruction, significant dilatation, ileus, or free air. Appendix demonstrated. Scattered colonic diverticulosis. Very minimal focal strandy edema about a single diverticulum of the sigmoid colon in the left lower quadrant, image 76 series 2 and also along the sagittal reconstructions image 79 series 6. Findings suspicious for early focal acute diverticulitis. No free fluid, fluid collection, ascites, abscess, or hemorrhage. Vascular/Lymphatic: Negative for aneurysm. No acute vascular finding. No veno-occlusive process. No significant adenopathy. Reproductive: Unremarkable by CT. Other: No abdominal wall hernia or abnormality. No abdominopelvic ascites. Musculoskeletal: L5-S1 degenerative change. No acute osseous finding. No compression fracture. IMPRESSION: Findings suspicious for early subtle focal acute sigmoid diverticulitis in the left hemipelvis. No other acute intra-abdominal or pelvic finding. Electronically Signed   By: Judie PetitM.  Shick M.D.   On: 12/12/2018 18:10   Ct L-spine No Charge  Result Date: 12/12/2018 CLINICAL DATA:  43 y/o M; 1-2 weeks of lower back pain. Two days of abdominal pain with  nausea. EXAM: CT LUMBAR SPINE WITHOUT CONTRAST TECHNIQUE: Multidetector CT imaging of the lumbar spine was performed without intravenous contrast administration. Multiplanar CT image reconstructions were also generated. COMPARISON:  Concurrent and 07/03/2017 CT of abdomen and pelvis. 01/17/2018 CT chest. FINDINGS: Segmentation: 5 lumbar type vertebrae. Diminutive L1 ribs.  Alignment: Normal. Vertebrae: No acute fracture or focal pathologic process. Paraspinal and other soft tissues: Please refer to the concurrent CT of the abdomen and pelvis. Disc levels: L5-S1 mild loss of intervertebral disc space height, small central disc protrusion, endplate marginal osteophytes, and mild facet hypertrophy. Mild bilateral neural foraminal stenosis. No significant spinal canal stenosis. IMPRESSION: 1. No acute fracture or malalignment of the lumbar spine. 2. Mild L5-S1 degenerative changes. No significant spinal canal stenosis. Electronically Signed   By: Mitzi Hansen M.D.   On: 12/12/2018 18:19    Procedures Procedures (including critical care time)  Medications Ordered in ED Medications  ondansetron (ZOFRAN) injection 4 mg (4 mg Intravenous Given 12/12/18 1632)  morphine 4 MG/ML injection 4 mg (4 mg Intravenous Given 12/12/18 1635)  iopamidol (ISOVUE-300) 61 % injection 100 mL (100 mLs Intravenous Contrast Given 12/12/18 1724)     Initial Impression / Assessment and Plan / ED Course  I have reviewed the triage vital signs and the nursing notes.  Pertinent labs & imaging results that were available during my care of the patient were reviewed by me and considered in my medical decision making (see chart for details).     Patient presents with abdominal pain, nausea, and bloating for the last 2 days. Patient is nontoxic appearing, afebrile, not tachycardic, not tachypneic, not hypotensive, maintains excellent SPO2 on room air, and is in no apparent distress.  No leukocytosis.  Lab results overall  reassuring.  Evidence of diverticulitis on CT.  Patient also complained of lower back pain.  This was reproducible on exam.  Neurovascularly intact.  No acute abnormalities on CT. Dissection considered, but thought less likely based on duration of complaints, complaint description, and physical exam elements.  Patient to follow-up with gastroenterology. The patient was given instructions for home care as well as return precautions. Patient voices understanding of these instructions, accepts the plan, and is comfortable with discharge.    Findings and plan of care discussed with Alvira Monday, MD.   Vitals:   12/12/18 1434 12/12/18 1435 12/12/18 1640 12/12/18 1842  BP:  (!) 142/98 136/89 140/89  Pulse:  97 81 68  Resp:  18 15 13   Temp:  98.3 F (36.8 C)    TempSrc:  Oral    SpO2:  98% 99% 100%  Weight: (!) 138.8 kg     Height: 5\' 11"  (1.803 m)        Final Clinical Impressions(s) / ED Diagnoses   Final diagnoses:  Tenderness of lumbar region  Sigmoid diverticulitis    ED Discharge Orders         Ordered    ciprofloxacin (CIPRO) 500 MG tablet  2 times daily     12/12/18 1832    metroNIDAZOLE (FLAGYL) 500 MG tablet  2 times daily     12/12/18 1832    ondansetron (ZOFRAN ODT) 4 MG disintegrating tablet  Every 8 hours PRN     12/12/18 1832    dicyclomine (BENTYL) 20 MG tablet  2 times daily     12/12/18 1832    methocarbamol (ROBAXIN) 500 MG tablet  2 times daily     12/12/18 1838    lidocaine (LIDODERM) 5 %  Every 24 hours     12/12/18 1838           Anselm Pancoast, PA-C 12/12/18 1859    Alvira Monday, MD 12/14/18 1932

## 2018-12-12 NOTE — ED Notes (Signed)
Pt to CT via Rad Tech 

## 2018-12-13 MED FILL — METHOCARBAMOL 500 MG TABLET: 500 | 10 days supply | Qty: 20 | Fill #0

## 2018-12-13 MED FILL — CIPROFLOXACIN HCL 500 MG TA: 500 | 7 days supply | Qty: 14 | Fill #0

## 2018-12-25 IMAGING — CT CT HEAD W/O CM
3 series · 15 of 47 positions shown, 18 images · non-contrast
Comparison: 03/29/2017

CLINICAL DATA: Left-sided headaches for several hours

EXAM:
CT HEAD WITHOUT CONTRAST
TECHNIQUE: Contiguous axial images were obtained from the base of the skull
through the vertex without intravenous contrast.

[Series 2: head wo · axial · 0.47mm/px · z∈[+1339,+1484]mm · 9 of 35 slices shown, 12 images]
[im 3/35  brain]
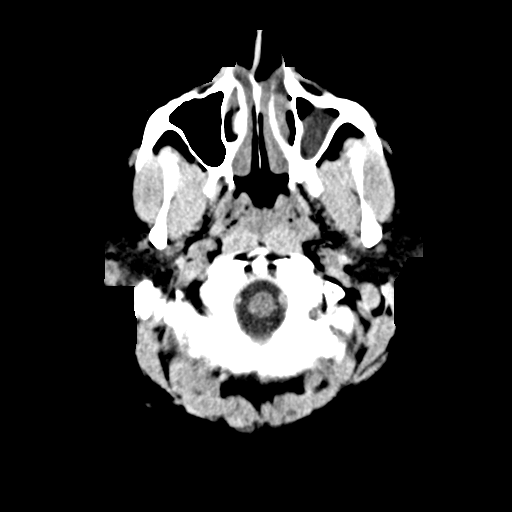
[im 3/35  bone]
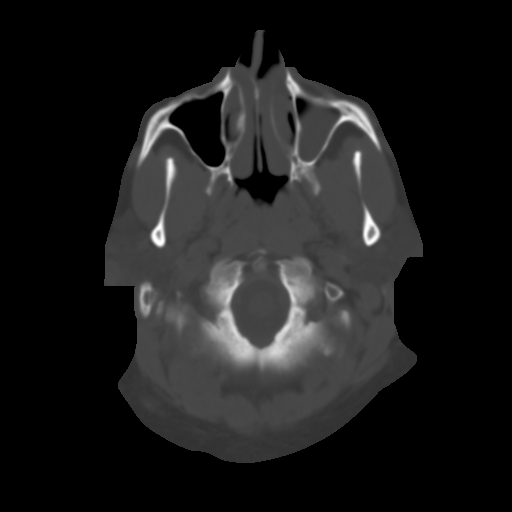
[im 6/35  brain]
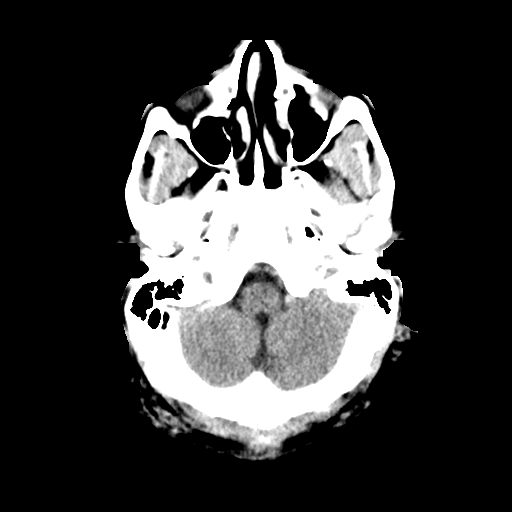
[im 10/35  brain]
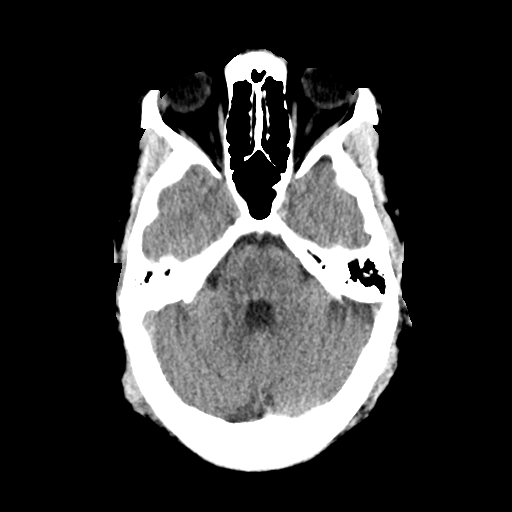
[im 13/35  brain]
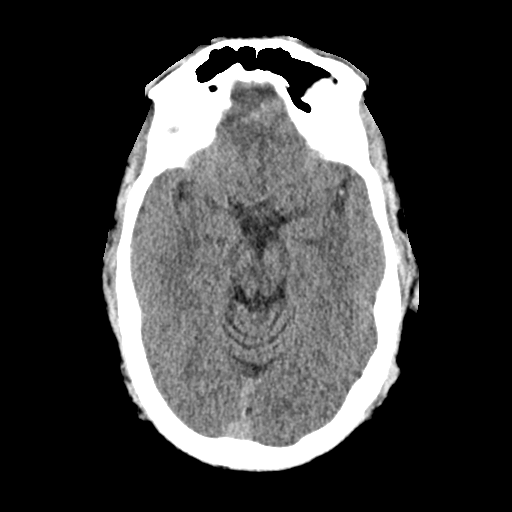
[im 18/35  brain]
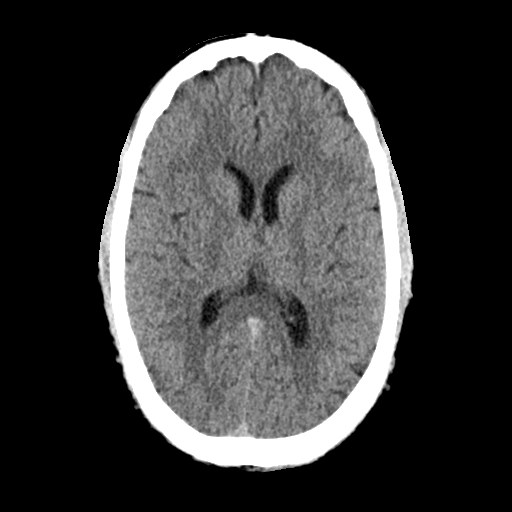
[im 18/35  bone]
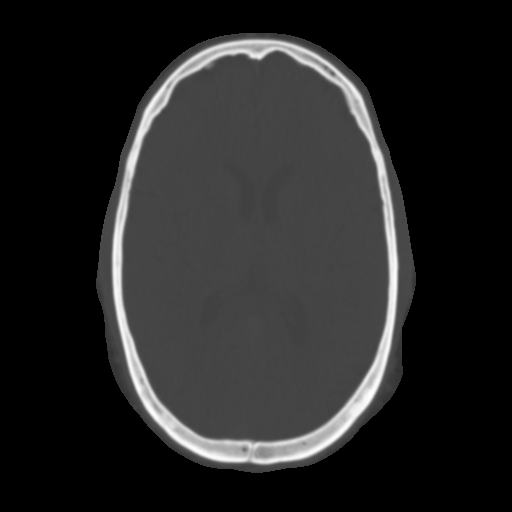
[im 22/35  brain]
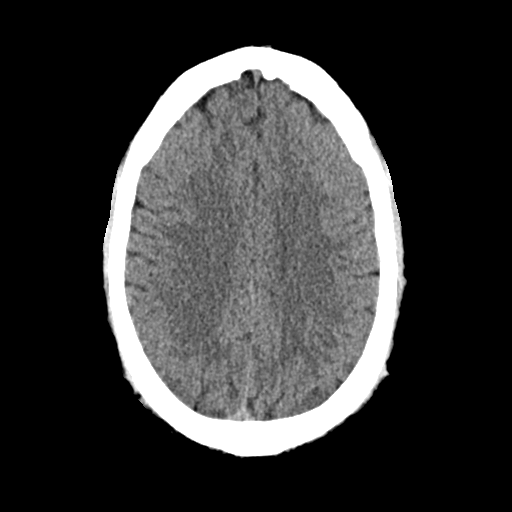
[im 25/35  brain]
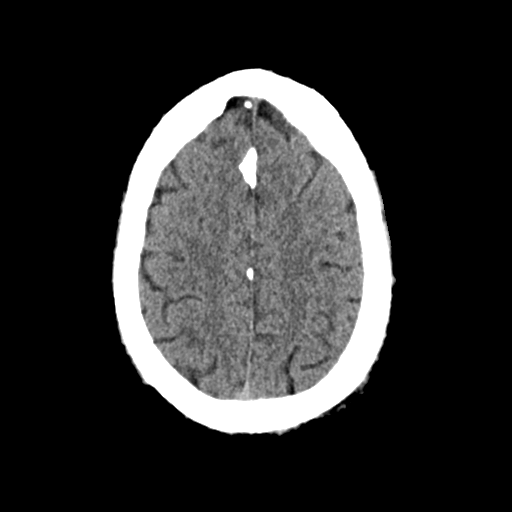
[im 29/35  brain]
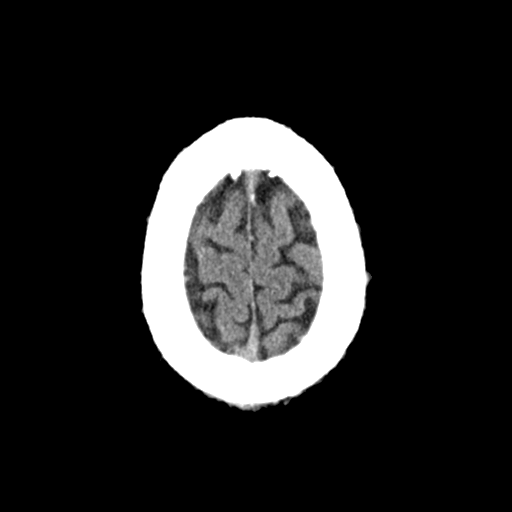
[im 32/35  brain]
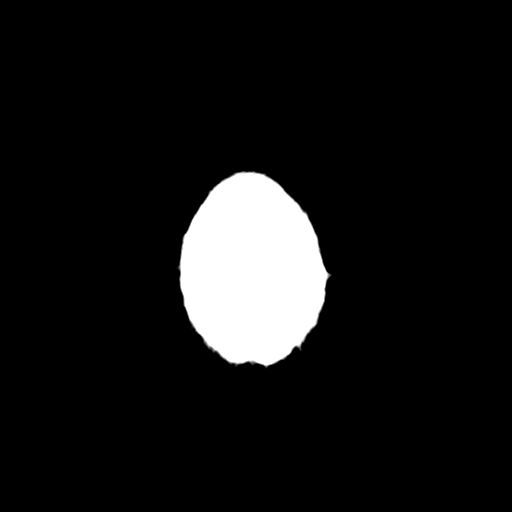
[im 32/35  bone]
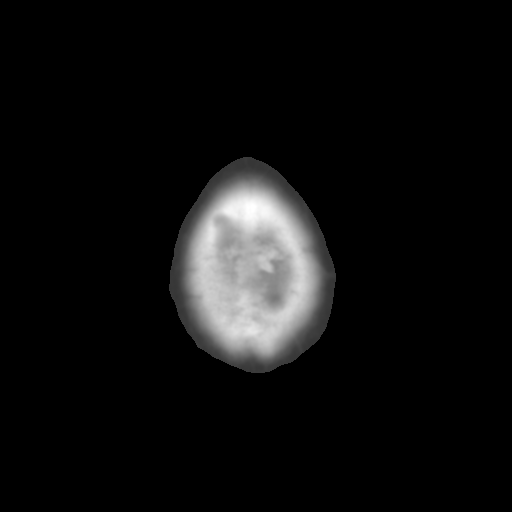

[Series 4: coronal soft · coronal · 0.33mm/px · 3 of 74 slices shown]
[im 25/74  brain]
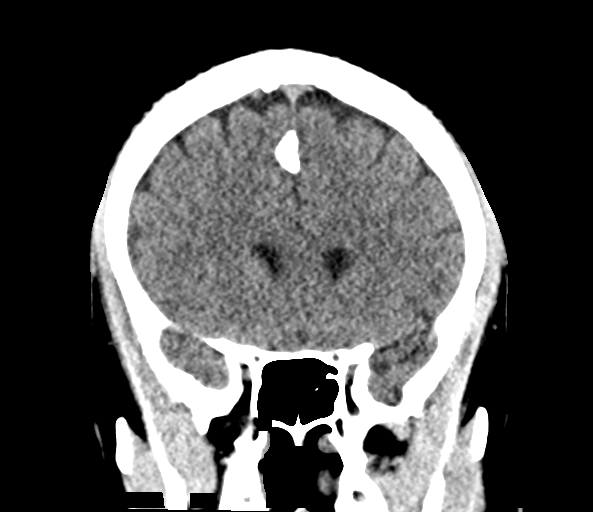
[im 33/74  brain]
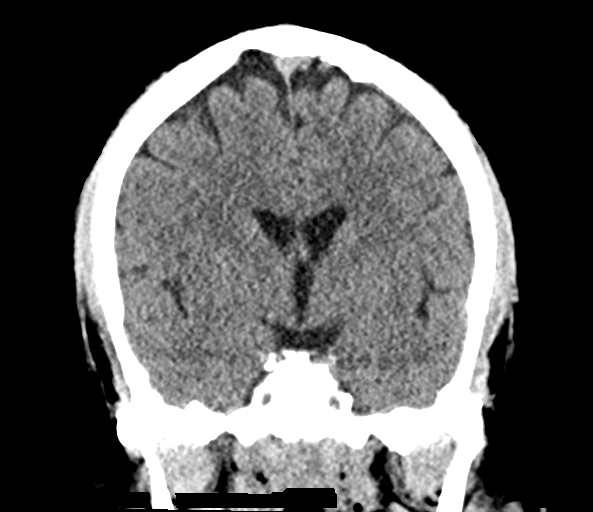
[im 41/74  brain]
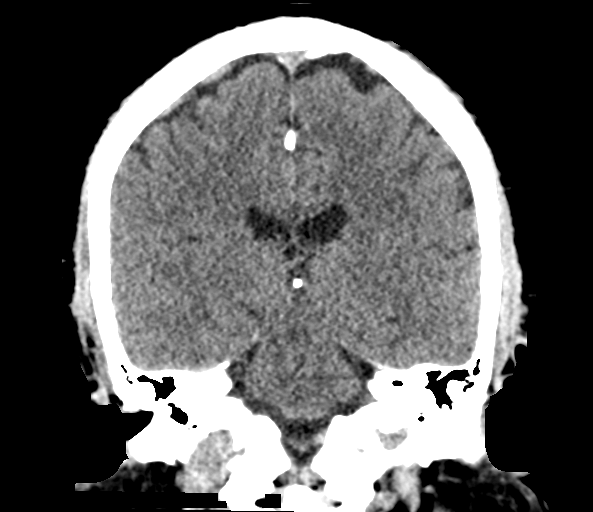

[Series 5: sag soft · sagittal · 0.33mm/px · 3 of 66 slices shown]
[im 22/66  brain]
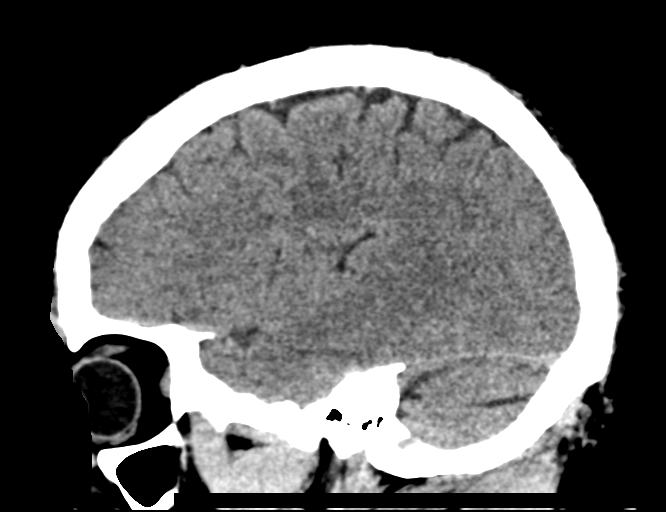
[im 33/66  brain]
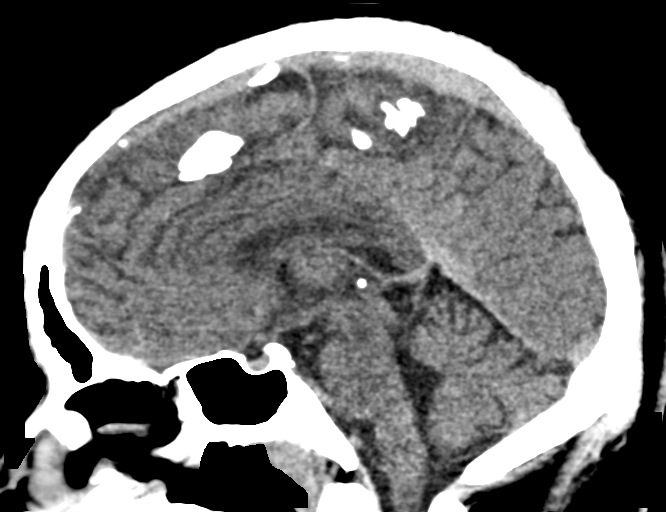
[im 44/66  brain]
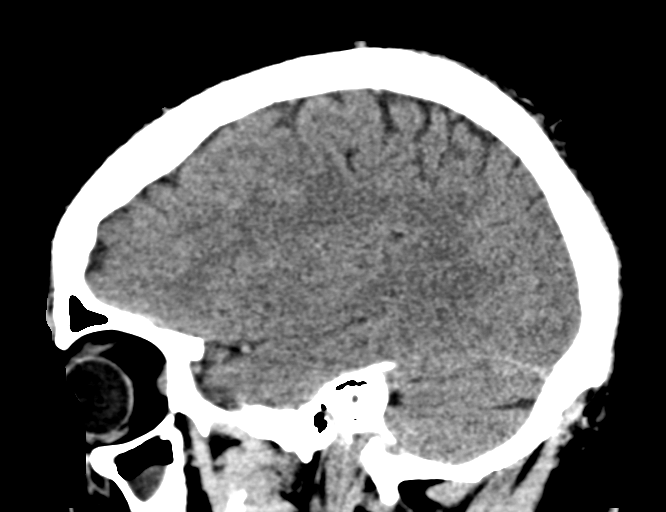

[15 of 47 positions shown; findings below may reference images not displayed]

FINDINGS: Brain: No evidence of acute infarction, hemorrhage, hydrocephalus,
extra-axial collection or mass lesion/mass effect.

Vascular: No hyperdense vessel or unexpected calcification.

Skull: Normal. Negative for fracture or focal lesion.

Sinuses/Orbits: Mucosal retention cyst is noted in the left
maxillary antrum. The orbits are within normal limits.

Other: None.
IMPRESSION: No acute intracranial abnormality noted.

## 2019-01-13 ENCOUNTER — Encounter (HOSPITAL_BASED_OUTPATIENT_CLINIC_OR_DEPARTMENT_OTHER): Payer: Self-pay | Admitting: Emergency Medicine

## 2019-01-13 ENCOUNTER — Emergency Department (HOSPITAL_BASED_OUTPATIENT_CLINIC_OR_DEPARTMENT_OTHER)
Admission: EM | Admit: 2019-01-13 | Discharge: 2019-01-13 | Disposition: A | Discharge status: Home / Self Care | Payer: Self-pay | Attending: Emergency Medicine | Admitting: Emergency Medicine

## 2019-01-13 ENCOUNTER — Other Ambulatory Visit: Payer: Self-pay

## 2019-01-13 DIAGNOSIS — I10 Essential (primary) hypertension: Secondary | ICD-10-CM | POA: Insufficient documentation

## 2019-01-13 DIAGNOSIS — Z7982 Long term (current) use of aspirin: Secondary | ICD-10-CM | POA: Insufficient documentation

## 2019-01-13 DIAGNOSIS — F17210 Nicotine dependence, cigarettes, uncomplicated: Secondary | ICD-10-CM | POA: Insufficient documentation

## 2019-01-13 DIAGNOSIS — Z79899 Other long term (current) drug therapy: Secondary | ICD-10-CM | POA: Insufficient documentation

## 2019-01-13 DIAGNOSIS — R112 Nausea with vomiting, unspecified: Secondary | ICD-10-CM | POA: Insufficient documentation

## 2019-01-13 DIAGNOSIS — Z7984 Long term (current) use of oral hypoglycemic drugs: Secondary | ICD-10-CM | POA: Insufficient documentation

## 2019-01-13 DIAGNOSIS — E119 Type 2 diabetes mellitus without complications: Secondary | ICD-10-CM | POA: Insufficient documentation

## 2019-01-13 DIAGNOSIS — R350 Frequency of micturition: Secondary | ICD-10-CM | POA: Insufficient documentation

## 2019-01-13 LAB — COMPREHENSIVE METABOLIC PANEL
ALBUMIN: 4 g/dL (ref 3.5–5.0)
ALT: 25 U/L (ref 0–44)
ANION GAP: 8 (ref 5–15)
AST: 20 U/L (ref 15–41)
Alkaline Phosphatase: 109 U/L (ref 38–126)
BUN: 12 mg/dL (ref 6–20)
CALCIUM: 9.2 mg/dL (ref 8.9–10.3)
CO2: 24 mmol/L (ref 22–32)
Chloride: 102 mmol/L (ref 98–111)
Creatinine, Ser: 1.02 mg/dL (ref 0.61–1.24)
GFR calc non Af Amer: >60 mL/min (ref >60)
Glucose, Bld: 193 mg/dL — ABNORMAL HIGH (ref 70–99)
POTASSIUM: 3.5 mmol/L (ref 3.5–5.1)
Sodium: 134 mmol/L — ABNORMAL LOW (ref 135–145)
Total Bilirubin: 0.5 mg/dL (ref 0.3–1.2)
Total Protein: 7.5 g/dL (ref 6.5–8.1)

## 2019-01-13 LAB — URINALYSIS, ROUTINE W REFLEX MICROSCOPIC
Bilirubin Urine: NEGATIVE
Glucose, UA: NEGATIVE mg/dL
HGB URINE DIPSTICK: NEGATIVE
KETONES UR: NEGATIVE mg/dL
Leukocytes,Ua: NEGATIVE
Nitrite: NEGATIVE
PROTEIN: NEGATIVE mg/dL
Specific Gravity, Urine: 1.025 (ref 1.005–1.030)
pH: 6 (ref 5.0–8.0)

## 2019-01-13 LAB — CBC
HCT: 43.8 % (ref 39.0–52.0)
HEMOGLOBIN: 13.8 g/dL (ref 13.0–17.0)
MCH: 26.8 pg (ref 26.0–34.0)
MCHC: 31.5 g/dL (ref 30.0–36.0)
MCV: 85.2 fL (ref 80.0–100.0)
NRBC: 0 % (ref 0.0–0.2)
Platelets: 292 10*3/uL (ref 150–400)
RBC: 5.14 MIL/uL (ref 4.22–5.81)
RDW: 14.2 % (ref 11.5–15.5)
WBC: 7.4 10*3/uL (ref 4.0–10.5)

## 2019-01-13 LAB — CBG MONITORING, ED: Glucose-Capillary: 172 mg/dL — ABNORMAL HIGH (ref 70–99)

## 2019-01-13 MED ORDER — ONDANSETRON HCL 4 MG/2ML IJ SOLN
4.0000 mg | Freq: Once | INTRAMUSCULAR | Status: AC | PRN
  Administered 2019-01-13: 4 mg via INTRAVENOUS
  Filled 2019-01-13: qty 2

## 2019-01-13 MED ORDER — ONDANSETRON 4 MG PO TBDP: 4.0000 mg | ORAL_TABLET | Freq: Three times a day (TID) | ORAL | 0 refills | Status: DC | PRN

## 2019-01-13 MED ORDER — SODIUM CHLORIDE 0.9 % IV BOLUS
1000.0000 mL | Freq: Once | INTRAVENOUS | Status: AC
  Administered 2019-01-13: 1000 mL via INTRAVENOUS

## 2019-01-13 NOTE — ED Provider Notes (Signed)
[facility] EMERGENCY DEPARTMENT Provider Note   CSN: [id number] Arrival date & time: 01/13/19  1346    History   Chief Complaint Chief Complaint  Patient presents with  . Fever    HPI Dean Hunt is a 43 y.o. male.     HPI Patient presents with fever.  States it was up to 102.  Has had nausea and vomiting.  No cough or abdominal pain.  No fevers.  No sick contacts.  No sore throat.  States he has had some urinary frequency.  States he saw his PCP a few days ago and they thought it was just his sugar being high to give him the frequency.  States did not check urine. Past Medical History:  Diagnosis Date  . Anxiety   . Depression   . Diabetes mellitus without complication (HCC)   . History of methamphetamine use   . Hypertension   . Obsessive-compulsive disorder     There are no active problems to display for this patient.   Past Surgical History:  Procedure Laterality Date  . TONSILLECTOMY    . WISDOM TOOTH EXTRACTION          Home Medications    Prior to Admission medications   Medication Sig Start Date End Date Taking? Authorizing Provider  ALPRAZolam (XANAX XR) 2 MG 24 hr tablet Take 2 mg by mouth at bedtime.     [provider]  ALPRAZolam Prudy Feeler) 1 MG tablet Take 1 mg by mouth at bedtime as needed for anxiety.    [provider]  amLODipine (NORVASC) 5 MG tablet Take 5 mg by mouth daily.    [provider]  aspirin EC 81 MG tablet Take 81 mg by mouth daily.    [provider]  ciprofloxacin (CIPRO) 500 MG tablet Take 1 tablet (500 mg total) by mouth 2 (two) times daily. 12/12/18   Joy, Shawn C, PA-C  dicyclomine (BENTYL) 20 MG tablet Take 1 tablet (20 mg total) by mouth 2 (two) times daily. 12/12/18   Joy, Shawn C, PA-C  famotidine (PEPCID) 20 MG tablet Take 1 tablet (20 mg total) by mouth 2 (two) times daily. 04/15/18   Law, Waylan Boga, PA-C  glipiZIDE (GLUCOTROL) 5 MG tablet Take by mouth daily before breakfast.     [provider]  hydrocortisone (ANUSOL-HC) 2.5 % rectal cream Apply rectally 2 times daily 04/15/18   Law, Waylan Boga, PA-C  lidocaine (LIDODERM) 5 % Place 1 patch onto the skin daily. Remove & Discard patch within 12 hours or as directed by MD 12/12/18   Anselm Pancoast, PA-C  meclizine (ANTIVERT) 25 MG tablet Take 1 tablet (25 mg total) by mouth 3 (three) times daily as needed for dizziness. 07/02/18   Melene Plan, DO  methocarbamol (ROBAXIN) 500 MG tablet Take 1 tablet (500 mg total) by mouth 2 (two) times daily. 12/12/18   Joy, Shawn C, PA-C  METOPROLOL SUCCINATE ER PO Take 25 mg by mouth 2 (two) times daily.     [provider]  metroNIDAZOLE (FLAGYL) 500 MG tablet Take 1 tablet (500 mg total) by mouth 2 (two) times daily. 12/12/18   Joy, Shawn C, PA-C  omeprazole (PRILOSEC) 20 MG capsule Take 20 mg by mouth daily.    [provider]  ondansetron (ZOFRAN) 4 MG tablet Take 1 tablet (4 mg total) by mouth every 6 (six) hours. 04/15/18   Law, Waylan Boga, PA-C  ondansetron (ZOFRAN-ODT) 4 MG disintegrating tablet Take 1  tablet (4 mg total) by mouth every 8 (eight) hours as needed for nausea or vomiting. 01/13/19   Benjiman Core, MD  Vitamin D, Ergocalciferol, (DRISDOL) 50000 units CAPS capsule Take 50,000 Units by mouth every 7 (seven) days.    [provider]    Family History No family history on file.  Social History Social History   Tobacco Use  . Smoking status: Current Every Day Smoker    Packs/day: 0.50    Types: Cigarettes  . Smokeless tobacco: Never Used  Substance Use Topics  . Alcohol use: Yes    Comment: occ  . Drug use: No     Allergies   Compazine [prochlorperazine edisylate] and Penicillins   Review of Systems Review of Systems  Constitutional: Positive for chills and fever. Negative for appetite change.  HENT: Negative for congestion.   Respiratory: Negative for chest tightness and shortness of breath.   Gastrointestinal:  Positive for nausea and vomiting. Negative for abdominal pain.  Endocrine: Negative for polyphagia.  Genitourinary: Positive for frequency.  Musculoskeletal: Negative for back pain.  Skin: Negative for rash.  Neurological: Negative for light-headedness.  Psychiatric/Behavioral: Negative for confusion.     Physical Exam Updated Vital Signs BP (!) 158/110 (BP Location: Left Arm)   Pulse 89   Temp 99.1 F (37.3 C) (Oral)   Resp 18   Ht 6' (1.829 m)   Wt (!) 139.3 kg   SpO2 99%   BMI 41.64 kg/m   Physical Exam Vitals signs and nursing note reviewed.  Constitutional:      Appearance: Normal appearance.  HENT:     Head: Atraumatic.     Nose: Nose normal.     Mouth/Throat:     Mouth: Mucous membranes are moist.  Neck:     Musculoskeletal: Neck supple.  Cardiovascular:     Rate and Rhythm: Normal rate.  Pulmonary:     Effort: Pulmonary effort is normal.  Abdominal:     Tenderness: There is no abdominal tenderness.  Musculoskeletal:        General: No tenderness.  Skin:    Capillary Refill: Capillary refill takes less than 2 seconds.  Neurological:     Mental Status: He is alert.      ED Treatments / Results  Labs (all labs ordered are listed, but only abnormal results are displayed) Labs Reviewed  COMPREHENSIVE METABOLIC PANEL - Abnormal; Notable for the following components:      Result Value   Sodium 134 (*)    Glucose, Bld 193 (*)    All other components within normal limits  CBG MONITORING, ED - Abnormal; Notable for the following components:   Glucose-Capillary 172 (*)    All other components within normal limits  CBC  URINALYSIS, ROUTINE W REFLEX MICROSCOPIC    EKG None  Radiology No results found.  Procedures Procedures (including critical care time)  Medications Ordered in ED Medications  sodium chloride 0.9 % bolus 1,000 mL (0 mLs Intravenous Stopped 01/13/19 1557)  ondansetron (ZOFRAN) injection 4 mg (4 mg Intravenous Given 01/13/19 1452)       Initial Impression / Assessment and Plan / ED Course  I have reviewed the triage vital signs and the nursing notes.  Pertinent labs & imaging results that were available during my care of the patient were reviewed by me and considered in my medical decision making (see chart for details).        Patient has been feeling bad for around 4 days now.  Fevers for the last day.  Potentially could be flu however at this point I do not think a flu test would really help.  Lab work reassuring.  Feels somewhat better.  Tolerated orals.  Will discharge home.  Previous diverticulitis.  No abdominal tenderness at this time.  No heart murmur.  Final Clinical Impressions(s) / ED Diagnoses   Final diagnoses:  Nausea and vomiting, intractability of vomiting not specified, unspecified vomiting type    ED Discharge Orders         Ordered    ondansetron (ZOFRAN-ODT) 4 MG disintegrating tablet  Every 8 hours PRN     01/13/19 1553           Benjiman Core, MD 01/13/19 1601

## 2019-01-13 NOTE — Discharge Instructions (Addendum)
Watch for increasing abdominal pain.  Watch for other signs of infection.  Try and keep yourself hydrated.

## 2019-01-13 NOTE — ED Triage Notes (Signed)
Fever since yesterday. Vomited once today. Also reports weakness.

## 2019-01-13 NOTE — ED Notes (Addendum)
Pt c/o fever yesterday, states was 102 orally. Pt also c/o nausea, no emesis at this time. Denies cough or sore throat. Pt states his hansd and feet "feel cold", but he feels warm. Pt states decreased appetite. States he has been feeling tired and weak, but states  His glucose has been 130 for am check

## 2019-04-03 ENCOUNTER — Encounter (HOSPITAL_BASED_OUTPATIENT_CLINIC_OR_DEPARTMENT_OTHER): Payer: Self-pay | Admitting: *Deleted

## 2019-04-03 ENCOUNTER — Other Ambulatory Visit: Payer: Self-pay

## 2019-04-03 ENCOUNTER — Emergency Department (HOSPITAL_BASED_OUTPATIENT_CLINIC_OR_DEPARTMENT_OTHER): Payer: Self-pay

## 2019-04-03 ENCOUNTER — Emergency Department (HOSPITAL_BASED_OUTPATIENT_CLINIC_OR_DEPARTMENT_OTHER)
Admission: EM | Admit: 2019-04-03 | Discharge: 2019-04-03 | Disposition: A | Discharge status: Home / Self Care | Payer: Self-pay | Attending: Emergency Medicine | Admitting: Emergency Medicine

## 2019-04-03 DIAGNOSIS — F17210 Nicotine dependence, cigarettes, uncomplicated: Secondary | ICD-10-CM | POA: Insufficient documentation

## 2019-04-03 DIAGNOSIS — E119 Type 2 diabetes mellitus without complications: Secondary | ICD-10-CM | POA: Insufficient documentation

## 2019-04-03 DIAGNOSIS — R072 Precordial pain: Secondary | ICD-10-CM | POA: Insufficient documentation

## 2019-04-03 DIAGNOSIS — R002 Palpitations: Secondary | ICD-10-CM | POA: Insufficient documentation

## 2019-04-03 DIAGNOSIS — R0602 Shortness of breath: Secondary | ICD-10-CM | POA: Insufficient documentation

## 2019-04-03 DIAGNOSIS — I10 Essential (primary) hypertension: Secondary | ICD-10-CM | POA: Insufficient documentation

## 2019-04-03 LAB — CBC
HCT: 44.7 % (ref 39.0–52.0)
Hemoglobin: 14 g/dL (ref 13.0–17.0)
MCH: 26.7 pg (ref 26.0–34.0)
MCHC: 31.3 g/dL (ref 30.0–36.0)
MCV: 85.1 fL (ref 80.0–100.0)
Platelets: 286 10*3/uL (ref 150–400)
RBC: 5.25 MIL/uL (ref 4.22–5.81)
RDW: 14 % (ref 11.5–15.5)
WBC: 7.2 10*3/uL (ref 4.0–10.5)
nRBC: 0 % (ref 0.0–0.2)

## 2019-04-03 LAB — BASIC METABOLIC PANEL
Anion gap: 8 (ref 5–15)
BUN: 12 mg/dL (ref 6–20)
CO2: 24 mmol/L (ref 22–32)
Calcium: 8.7 mg/dL — ABNORMAL LOW (ref 8.9–10.3)
Chloride: 105 mmol/L (ref 98–111)
Creatinine, Ser: 0.92 mg/dL (ref 0.61–1.24)
GFR calc Af Amer: >60 mL/min (ref >60)
GFR calc non Af Amer: >60 mL/min (ref >60)
Glucose, Bld: 168 mg/dL — ABNORMAL HIGH (ref 70–99)
Potassium: 3.6 mmol/L (ref 3.5–5.1)
Sodium: 137 mmol/L (ref 135–145)

## 2019-04-03 LAB — D-DIMER, QUANTITATIVE: D-Dimer, Quant: <0.27 ug/mL-FEU (ref 0.00–0.50)

## 2019-04-03 LAB — TROPONIN I: Troponin I: <0.03 ng/mL (ref <0.03)

## 2019-04-03 MED ORDER — HYDROCHLOROTHIAZIDE 25 MG PO TABS: 25.0000 mg | ORAL_TABLET | Freq: Every day | ORAL | 0 refills | Status: AC

## 2019-04-03 MED ORDER — SODIUM CHLORIDE 0.9% FLUSH
3.0000 mL | Freq: Once | INTRAVENOUS | Status: DC
  Filled 2019-04-03: qty 3

## 2019-04-03 MED ORDER — ALUM & MAG HYDROXIDE-SIMETH 200-200-20 MG/5ML PO SUSP
15.0000 mL | Freq: Once | ORAL | Status: AC
  Administered 2019-04-03: 15 mL via ORAL
  Filled 2019-04-03: qty 30

## 2019-04-03 NOTE — Discharge Instructions (Signed)
You were seen in the emergency department today with rapid heartbeat, shortness of breath, chest discomfort.  Your lab work and x-ray are normal.  I am changing your blood pressure medication.  We are stopping the amlodipine and starting the HCTZ.  You can pick this up at the pharmacy indicated.  Call your PCP today to make them aware of your ED visit and a medication change.  Schedule the next available follow-up appointment.  Record your blood pressures daily and review these with your PCP at your next visit.  It is helpful to take your blood pressure at the same time every day.   You should return to the emergency department with any sudden worsening chest pain, heart palpitations, shortness of breath, any other new or worsening symptoms.

## 2019-04-03 NOTE — ED Triage Notes (Addendum)
Pt c/o sob and palpitations  x 1 week

## 2019-04-03 NOTE — ED Provider Notes (Signed)
Emergency Department Provider Note   I have reviewed the triage vital signs and the nursing notes.   HISTORY  Chief Complaint Shortness of Breath   HPI Dean Hunt is a 43 y.o. male with PMH of DM and HTN presents to the emergency department for evaluation of burning chest discomfort, heart palpitations, exertional dyspnea.  Patient woke up last night with burning epigastric and substernal chest discomfort.  He took Tums and his symptoms improved.  He does have history of GERD and states this does feel similar.  He is continued to have some burning discomfort along with the sensation of racing heartbeat.  Patient states he has had the palpitation sensation over the past several days.  He has had this with amlodipine in the past and 1 months ago he was restarted on this medication.  He states that initially he did not have problems but has since had return of his palpitations.  His shortness of breath symptoms are mainly with exertion and are worse today.  Denies any pleuritic chest pain.  No radiation of symptoms or other modifying factors. No fever, chills, cough, or other URI symptoms.    Past Medical History:  Diagnosis Date  . Anxiety   . Depression   . Diabetes mellitus without complication (HCC)   . History of methamphetamine use   . Hypertension   . Obsessive-compulsive disorder     There are no active problems to display for this patient.   Past Surgical History:  Procedure Laterality Date  . TONSILLECTOMY    . WISDOM TOOTH EXTRACTION      Allergies Compazine [prochlorperazine edisylate] and Penicillins  History reviewed. No pertinent family history.  Social History Social History   Tobacco Use  . Smoking status: Current Every Day Smoker    Packs/day: 0.50    Types: Cigarettes  . Smokeless tobacco: Never Used  Substance Use Topics  . Alcohol use: Yes    Comment: occ  . Drug use: No    Review of Systems  Constitutional: No fever/chills Eyes: No  visual changes. ENT: No sore throat. Cardiovascular: Positive burning chest pain and palpitations.  Respiratory: Positive shortness of breath. Gastrointestinal: No abdominal pain.  No nausea, no vomiting.  No diarrhea.  No constipation. Genitourinary: Negative for dysuria. Musculoskeletal: Negative for back pain. Skin: Negative for rash. Neurological: Negative for headaches, focal weakness or numbness.  10-point ROS otherwise negative.  ____________________________________________   PHYSICAL EXAM:  VITAL SIGNS: ED Triage Vitals  Enc Vitals Group     BP 04/03/19 1252 (!) 143/93     Pulse Rate 04/03/19 1252 80     Resp 04/03/19 1252 16     Temp 04/03/19 1252 98.4 F (36.9 C)     Temp Source 04/03/19 1252 Oral     SpO2 04/03/19 1252 99 %     Weight 04/03/19 1307 300 lb (136.1 kg)     Height 04/03/19 1307 5\' 11"  (1.803 m)   Constitutional: Alert and oriented. Well appearing and in no acute distress. Eyes: Conjunctivae are normal.  Head: Atraumatic. Nose: No congestion/rhinnorhea. Mouth/Throat: Mucous membranes are moist.  Neck: No stridor.   Cardiovascular: Normal rate, regular rhythm. Good peripheral circulation. Grossly normal heart sounds.   Respiratory: Normal respiratory effort.  No retractions. Lungs CTAB. Gastrointestinal: Soft and nontender. No distention.  Musculoskeletal: No gross deformities of extremities. Neurologic:  Normal speech and language.  Skin:  Skin is warm, dry and intact. No rash noted.   ____________________________________________   Vickie Epley (  all labs ordered are listed, but only abnormal results are displayed)  Labs Reviewed  BASIC METABOLIC PANEL - Abnormal; Notable for the following components:      Result Value   Glucose, Bld 168 (*)    Calcium 8.7 (*)    All other components within normal limits  CBC  TROPONIN I  D-DIMER, QUANTITATIVE (NOT [facility])   ____________________________________________  EKG   EKG Interpretation   Date/Time:  Wednesday Apr 03 2019 12:51:04 EDT Ventricular Rate:  84 PR Interval:    QRS Duration: 97 QT Interval:  344 QTC Calculation: 407 R Axis:   21 Text Interpretation:  Sinus rhythm Low voltage, precordial leads Nonspecific ST changes. Similar to prior.  No STEMI.  Confirmed by Alona Bene,  [phone]) on 04/03/2019 1:13:43 PM Also confirmed by Alona Bene,  [phone]), editor Barbette Hairassel, Kerry [phone])  on 04/03/2019 3:07:28 PM       ____________________________________________  RADIOLOGY  Dg Chest Portable 1 View  Result Date: 04/03/2019 CLINICAL DATA:  Shortness of breath with cardiac palpitations EXAM: PORTABLE CHEST 1 VIEW COMPARISON:  November 26, 2018 FINDINGS: No edema or consolidation. Heart size and pulmonary vascularity are within normal limits. No adenopathy. No pneumothorax. No bone lesions. IMPRESSION: No edema or consolidation. Electronically Signed   By: Bretta BangWilliam  Woodruff III M.D.   On: 04/03/2019 13:42    ____________________________________________   PROCEDURES  Procedure(s) performed:   Procedures  None ____________________________________________   INITIAL IMPRESSION / ASSESSMENT AND PLAN / ED COURSE  Pertinent labs & imaging results that were available during my care of the patient were reviewed by me and considered in my medical decision making (see chart for details).   Patient presents to the emergency department with atypical chest pain with mild exertional dyspnea and heart palpitations.  Vital signs here are normal including heart rate and pulse ox.  Pressure is mildly elevated.  He has had palpitation symptoms with amlodipine in the past and does have history of anxiety.  Could consider changing his amlodipine pending lab work.  Will obtain chest x-ray along with screening labs including troponin.  Patient's chest discomfort seems most consistent with GERD.  Will give Maalox.  EKG similar to prior and without sign of acute ischemia.  Also considering PE but  feel this is much less likely.  Have added d-dimer.  Imaging and labs reviewed. No acute findings. Plan for discharge with PCP follow up plan.  ____________________________________________  FINAL CLINICAL IMPRESSION(S) / ED DIAGNOSES  Final diagnoses:  Precordial chest pain  Shortness of breath  Palpitations     MEDICATIONS GIVEN DURING THIS VISIT:  Medications  alum & mag hydroxide-simeth (MAALOX/MYLANTA) 200-200-20 MG/5ML suspension 15 mL (15 mLs Oral Given 04/03/19 1333)     NEW OUTPATIENT MEDICATIONS STARTED DURING THIS VISIT:  Discharge Medication List as of 04/03/2019  2:37 PM    START taking these medications   Details  hydrochlorothiazide (HYDRODIURIL) 25 MG tablet Take 1 tablet (25 mg total) by mouth daily for 30 days., Starting Wed 04/03/2019, Until Fri 05/03/2019, Normal        Note:  This document was prepared using Dragon voice recognition software and may include unintentional dictation errors.  Alona Bene , MD Emergency Medicine    , Arlyss Repress G, MD 04/03/19 2016

## 2019-04-14 ENCOUNTER — Emergency Department (HOSPITAL_BASED_OUTPATIENT_CLINIC_OR_DEPARTMENT_OTHER)
Admission: EM | Admit: 2019-04-14 | Discharge: 2019-04-14 | Disposition: A | Discharge status: Home / Self Care | Payer: Self-pay | Attending: Emergency Medicine | Admitting: Emergency Medicine

## 2019-04-14 ENCOUNTER — Other Ambulatory Visit: Payer: Self-pay

## 2019-04-14 ENCOUNTER — Emergency Department (HOSPITAL_BASED_OUTPATIENT_CLINIC_OR_DEPARTMENT_OTHER): Payer: Self-pay

## 2019-04-14 ENCOUNTER — Encounter (HOSPITAL_BASED_OUTPATIENT_CLINIC_OR_DEPARTMENT_OTHER): Payer: Self-pay | Admitting: *Deleted

## 2019-04-14 DIAGNOSIS — I10 Essential (primary) hypertension: Secondary | ICD-10-CM | POA: Insufficient documentation

## 2019-04-14 DIAGNOSIS — Z7984 Long term (current) use of oral hypoglycemic drugs: Secondary | ICD-10-CM | POA: Insufficient documentation

## 2019-04-14 DIAGNOSIS — E119 Type 2 diabetes mellitus without complications: Secondary | ICD-10-CM | POA: Insufficient documentation

## 2019-04-14 DIAGNOSIS — Z79899 Other long term (current) drug therapy: Secondary | ICD-10-CM | POA: Insufficient documentation

## 2019-04-14 DIAGNOSIS — K5900 Constipation, unspecified: Secondary | ICD-10-CM | POA: Insufficient documentation

## 2019-04-14 DIAGNOSIS — R1084 Generalized abdominal pain: Secondary | ICD-10-CM

## 2019-04-14 DIAGNOSIS — Z7982 Long term (current) use of aspirin: Secondary | ICD-10-CM | POA: Insufficient documentation

## 2019-04-14 DIAGNOSIS — F17200 Nicotine dependence, unspecified, uncomplicated: Secondary | ICD-10-CM | POA: Insufficient documentation

## 2019-04-14 LAB — CBC WITH DIFFERENTIAL/PLATELET
Abs Immature Granulocytes: 0.03 10*3/uL (ref 0.00–0.07)
Basophils Absolute: 0.1 10*3/uL (ref 0.0–0.1)
Basophils Relative: 1 %
Eosinophils Absolute: 0.3 10*3/uL (ref 0.0–0.5)
Eosinophils Relative: 5 %
HCT: 43.8 % (ref 39.0–52.0)
Hemoglobin: 13.9 g/dL (ref 13.0–17.0)
Immature Granulocytes: 0 %
Lymphocytes Relative: 40 %
Lymphs Abs: 2.8 10*3/uL (ref 0.7–4.0)
MCH: 26.8 pg (ref 26.0–34.0)
MCHC: 31.7 g/dL (ref 30.0–36.0)
MCV: 84.6 fL (ref 80.0–100.0)
Monocytes Absolute: 0.7 10*3/uL (ref 0.1–1.0)
Monocytes Relative: 10 %
Neutro Abs: 3.2 10*3/uL (ref 1.7–7.7)
Neutrophils Relative %: 44 %
Platelets: 281 10*3/uL (ref 150–400)
RBC: 5.18 MIL/uL (ref 4.22–5.81)
RDW: 14.1 % (ref 11.5–15.5)
WBC: 7.2 10*3/uL (ref 4.0–10.5)
nRBC: 0 % (ref 0.0–0.2)

## 2019-04-14 LAB — URINALYSIS, ROUTINE W REFLEX MICROSCOPIC
Bilirubin Urine: NEGATIVE
Glucose, UA: NEGATIVE mg/dL
Hgb urine dipstick: NEGATIVE
Ketones, ur: NEGATIVE mg/dL
Leukocytes,Ua: NEGATIVE
Nitrite: NEGATIVE
Protein, ur: NEGATIVE mg/dL
Specific Gravity, Urine: 1.02 (ref 1.005–1.030)
pH: 6 (ref 5.0–8.0)

## 2019-04-14 LAB — COMPREHENSIVE METABOLIC PANEL
ALT: 20 U/L (ref 0–44)
AST: 16 U/L (ref 15–41)
Albumin: 3.8 g/dL (ref 3.5–5.0)
Alkaline Phosphatase: 97 U/L (ref 38–126)
Anion gap: 7 (ref 5–15)
BUN: 12 mg/dL (ref 6–20)
CO2: 24 mmol/L (ref 22–32)
Calcium: 8.8 mg/dL — ABNORMAL LOW (ref 8.9–10.3)
Chloride: 107 mmol/L (ref 98–111)
Creatinine, Ser: 0.92 mg/dL (ref 0.61–1.24)
GFR calc Af Amer: >60 mL/min (ref >60)
GFR calc non Af Amer: >60 mL/min (ref >60)
Glucose, Bld: 133 mg/dL — ABNORMAL HIGH (ref 70–99)
Potassium: 4 mmol/L (ref 3.5–5.1)
Sodium: 138 mmol/L (ref 135–145)
Total Bilirubin: 0.3 mg/dL (ref 0.3–1.2)
Total Protein: 7.3 g/dL (ref 6.5–8.1)

## 2019-04-14 LAB — LIPASE, BLOOD: Lipase: 28 U/L (ref 11–51)

## 2019-04-14 MED ORDER — SODIUM CHLORIDE 0.9 % IV BOLUS
1000.0000 mL | Freq: Once | INTRAVENOUS | Status: AC
  Administered 2019-04-14: 1000 mL via INTRAVENOUS

## 2019-04-14 NOTE — ED Provider Notes (Signed)
[facility] EMERGENCY DEPARTMENT Provider Note   CSN: [id number] Arrival date & time: 04/14/19  1242    History   Chief Complaint Chief Complaint  Patient presents with  . Abdominal Pain    HPI Dean Hunt is a 43 y.o. male who presents to the ED complaining of gradual onset, constant, upper abdominal pain x 1 week. Pt also endorses nausea and constipation. He has been taking Omeprazole, Pepcid, and Tums without relief as he has hx of GERD. Pt also attempted to use a saline enema without relief; he reports he had 1 small bowel movement this AM but he still feels like his abdomen is distended. He states he is having obstipation as well as an intermittent headache for the last week mostly in the frontal region. Pt has hx of headaches and states this feels similar to previous. He has been taking Tylenol without relief. No previous abdominal surgeries. Denies fever, chills, vomiting, diarrhea, urinary sx, or any other complaints at this time. No recent sick contact. No recent antibiotic use. No excessive EtOH or NSAID use.        Past Medical History:  Diagnosis Date  . Anxiety   . Depression   . Diabetes mellitus without complication (HCC)   . History of methamphetamine use   . Hypertension   . Obsessive-compulsive disorder     There are no active problems to display for this patient.   Past Surgical History:  Procedure Laterality Date  . TONSILLECTOMY    . WISDOM TOOTH EXTRACTION          Home Medications    Prior to Admission medications   Medication Sig Start Date End Date Taking? Authorizing Provider  ALPRAZolam (XANAX XR) 2 MG 24 hr tablet Take 2 mg by mouth at bedtime.    Yes [provider]  amLODipine (NORVASC) 5 MG tablet Take 5 mg by mouth daily.   Yes [provider]  famotidine (PEPCID) 20 MG tablet Take 1 tablet (20 mg total) by mouth 2 (two) times daily. 04/15/18  Yes Law, Waylan Boga, PA-C  METOPROLOL SUCCINATE ER PO Take 25 mg  by mouth 2 (two) times daily.    Yes [provider]  omeprazole (PRILOSEC) 20 MG capsule Take 20 mg by mouth daily.   Yes [provider]  ALPRAZolam Prudy Feeler) 1 MG tablet Take 1 mg by mouth at bedtime as needed for anxiety.    [provider]  aspirin EC 81 MG tablet Take 81 mg by mouth daily.    [provider]  ciprofloxacin (CIPRO) 500 MG tablet Take 1 tablet (500 mg total) by mouth 2 (two) times daily. 12/12/18   Joy, Shawn C, PA-C  dicyclomine (BENTYL) 20 MG tablet Take 1 tablet (20 mg total) by mouth 2 (two) times daily. 12/12/18   Joy, Shawn C, PA-C  glipiZIDE (GLUCOTROL) 5 MG tablet Take by mouth daily before breakfast.    [provider]  hydrochlorothiazide (HYDRODIURIL) 25 MG tablet Take 1 tablet (25 mg total) by mouth daily for 30 days. 04/03/19 05/03/19  Long, Arlyss Repress, MD  hydrocortisone (ANUSOL-HC) 2.5 % rectal cream Apply rectally 2 times daily 04/15/18   Law, Waylan Boga, PA-C  lidocaine (LIDODERM) 5 % Place 1 patch onto the skin daily. Remove & Discard patch within 12 hours or as directed by MD 12/12/18   Anselm Pancoast, PA-C  meclizine (ANTIVERT) 25 MG tablet Take 1 tablet (25 mg total) by mouth 3 (three) times daily  as needed for dizziness. 07/02/18   Melene Plan, DO  methocarbamol (ROBAXIN) 500 MG tablet Take 1 tablet (500 mg total) by mouth 2 (two) times daily. 12/12/18   Joy, Shawn C, PA-C  metroNIDAZOLE (FLAGYL) 500 MG tablet Take 1 tablet (500 mg total) by mouth 2 (two) times daily. 12/12/18   Joy, Shawn C, PA-C  ondansetron (ZOFRAN) 4 MG tablet Take 1 tablet (4 mg total) by mouth every 6 (six) hours. 04/15/18   Law, Waylan Boga, PA-C  ondansetron (ZOFRAN-ODT) 4 MG disintegrating tablet Take 1 tablet (4 mg total) by mouth every 8 (eight) hours as needed for nausea or vomiting. 01/13/19   Benjiman Core, MD  Vitamin D, Ergocalciferol, (DRISDOL) 50000 units CAPS capsule Take 50,000 Units by mouth every 7 (seven) days.    [provider]     Family History History reviewed. No pertinent family history.  Social History Social History   Tobacco Use  . Smoking status: Current Every Day Smoker    Packs/day: 0.50    Types: Cigarettes  . Smokeless tobacco: Never Used  Substance Use Topics  . Alcohol use: Yes    Comment: occ  . Drug use: No     Allergies   Compazine [prochlorperazine edisylate] and Penicillins   Review of Systems Review of Systems  Constitutional: Negative for chills and fever.  HENT: Negative for congestion.   Eyes: Negative for redness and visual disturbance.  Respiratory: Negative for cough and shortness of breath.   Cardiovascular: Negative for chest pain.  Gastrointestinal: Positive for abdominal pain, constipation and nausea. Negative for blood in stool, diarrhea and vomiting.  Genitourinary: Negative for dysuria and frequency.  Musculoskeletal: Negative for myalgias.  Skin: Negative for rash.  Neurological: Positive for headaches. Negative for dizziness, speech difficulty, weakness, light-headedness and numbness.     Physical Exam Updated Vital Signs BP (!) 144/101 (BP Location: Right Arm)   Pulse 86   Temp 98.4 F (36.9 C) (Oral)   Resp 16   Ht 5\' 11"  (1.803 m)   Wt 135.2 kg   SpO2 95%   BMI 41.56 kg/m   Physical Exam Vitals signs and nursing note reviewed.  Constitutional:      Appearance: He is not ill-appearing.  HENT:     Head: Normocephalic and atraumatic.  Eyes:     Conjunctiva/sclera: Conjunctivae normal.  Neck:     Musculoskeletal: Neck supple.  Cardiovascular:     Rate and Rhythm: Normal rate and regular rhythm.     Heart sounds: Normal heart sounds.  Pulmonary:     Effort: Pulmonary effort is normal.     Breath sounds: Normal breath sounds. No wheezing, rhonchi or rales.  Chest:     Chest wall: No tenderness.  Abdominal:     General: Abdomen is protuberant.     Palpations: Abdomen is soft.     Tenderness: There is no abdominal tenderness. There is no  right CVA tenderness, left CVA tenderness, guarding or rebound.     Comments: No tenderness to palpation diffusely  Skin:    General: Skin is warm and dry.  Neurological:     Mental Status: He is alert.     Comments: CN 2-12 grossly intact A&O x4 GCS 15 Sensation and strength intact Gait nonataxic including with tandem walking Coordination with finger-to-nose WNL Neg romberg, neg pronator drift       ED Treatments / Results  Labs (all labs ordered are listed, but only abnormal results are displayed) Labs Reviewed  COMPREHENSIVE METABOLIC PANEL - Abnormal; Notable for the following components:      Result Value   Glucose, Bld 133 (*)    Calcium 8.8 (*)    All other components within normal limits  LIPASE, BLOOD  CBC WITH DIFFERENTIAL/PLATELET  URINALYSIS, ROUTINE W REFLEX MICROSCOPIC    EKG None  Radiology Dg Abd Acute W/chest  Result Date: 04/14/2019 CLINICAL DATA:  Abdominal pain, nausea, constipation EXAM: DG ABDOMEN ACUTE W/ 1V CHEST COMPARISON:  04/03/2019 and previous FINDINGS: Heart size and mediastinal contours are within normal limits. Lungs are clear. No effusion. No free air. Stomach is decompressed. A few gas filled nondilated mid right abdominal small bowel loops. Moderate proximal colonic fecal material without dilatation, decompressed distally. There are no abnormal calcifications. Regional bones unremarkable. IMPRESSION: 1. No acute cardiopulmonary disease. 2. Nonobstructive bowel gas pattern with moderate proximal colonic fecal material. Electronically Signed   By: Corlis Leak  Hassell M.D.   On: 04/14/2019 13:58    Procedures Procedures (including critical care time)  Medications Ordered in ED Medications  sodium chloride 0.9 % bolus 1,000 mL (0 mLs Intravenous Stopped 04/14/19 1524)     Initial Impression / Assessment and Plan / ED Course  I have reviewed the triage vital signs and the nursing notes.  Pertinent labs & imaging results that were available  during my care of the patient were reviewed by me and considered in my medical decision making (see chart for details).    Pt is a 43 year old male who presents to the ED with 1 week of diffuse upper abdominal pain and nausea with constipation. He is not exquisitely tender on exam, no peritoneal signs. Low suspicion for acute abdomen. Normal bowel sounds throughout. Will get baseline bloodwork including CBC, CMP, lipase, and U/A along with acute abdominal series to rule out blockage. Regarding headache; pt has hx of these; does not feel worse than previous; reassuring neuro exam today without any deficits; do not feel patient needs imaging of his head; advised to take Ibuprofen or Tylenol for headache; this may be related to dehydration as well; 1 L NS bolus given in the ED for abdominal pain as well as headache.   CBC without leukocytosis. No electrolyte abnormalities on CMP. Liver function and kidney function within normal limits. Lipase negative. U/A without infection. Acute abdominal series with fecal matter in the proximal colon; non obstructive with bowel gas patterns. Updated patient on plan; likely experiencing diffuse pain due to constipation. Advised to take Miralax daily to help with movement of bowels. Pt reports he has miralax at home and will try this. He also has Colace and Docusate at home as he is taking care of his mother who is recovering from a stroke. Advised that he try Miralax for the next 1-2 days with increased fluid intake and if no relief he can try the Colace. Pt to call PCP's office on Tuesday for follow up. Strict return precautions discussed as well. He is in agreement with plan and stable for discharge home.       Final Clinical Impressions(s) / ED Diagnoses   Final diagnoses:  Generalized abdominal pain  Constipation, unspecified constipation type    ED Discharge Orders    None       Tanda RockersVenter, Mouhamed Glassco, PA-C 04/14/19 1601    Gwyneth SproutPlunkett, Whitney, MD 04/15/19 2028

## 2019-04-14 NOTE — Discharge Instructions (Signed)
You were seen in the ED today for abdominal pain and constipation; your labwork was reassuring; your xray showed a moderate amount of stool in your colon; no obstruction was seen. Please take Miralax daily as instructed to help with movement of your bowels; please also increase your daily fluid intake to help the Miralax work properly. If no improvement in 1-2 days you can try taking the Colace you already have at home.   Return to the ED for any worsening symptoms including worsening pain, inability to have a bowel movement despite the regimen listed above, if you start vomiting blood or notice blood in your stool, or if you develop fever > 100.4.

## 2019-04-14 NOTE — ED Triage Notes (Signed)
Nauseous for a week. Has been taking Omeprazole, Pepcid and Tums with no relief. No good BM for a week.  Did a saline enema by himself with minimal relief.  Also c/o headache.

## 2019-10-19 ENCOUNTER — Other Ambulatory Visit: Payer: Self-pay

## 2019-10-19 ENCOUNTER — Encounter (HOSPITAL_BASED_OUTPATIENT_CLINIC_OR_DEPARTMENT_OTHER): Payer: Self-pay | Admitting: *Deleted

## 2019-10-19 ENCOUNTER — Emergency Department (HOSPITAL_BASED_OUTPATIENT_CLINIC_OR_DEPARTMENT_OTHER)
Admission: EM | Admit: 2019-10-19 | Discharge: 2019-10-19 | Disposition: A | Discharge status: Home / Self Care | Payer: Self-pay | Attending: Emergency Medicine | Admitting: Emergency Medicine

## 2019-10-19 ENCOUNTER — Emergency Department (HOSPITAL_BASED_OUTPATIENT_CLINIC_OR_DEPARTMENT_OTHER): Payer: Self-pay

## 2019-10-19 DIAGNOSIS — R002 Palpitations: Secondary | ICD-10-CM | POA: Insufficient documentation

## 2019-10-19 DIAGNOSIS — R197 Diarrhea, unspecified: Secondary | ICD-10-CM | POA: Insufficient documentation

## 2019-10-19 LAB — URINALYSIS, ROUTINE W REFLEX MICROSCOPIC
Bilirubin Urine: NEGATIVE
Glucose, UA: NEGATIVE mg/dL
Hgb urine dipstick: NEGATIVE
Ketones, ur: NEGATIVE mg/dL
Leukocytes,Ua: NEGATIVE
Nitrite: NEGATIVE
Protein, ur: NEGATIVE mg/dL
Specific Gravity, Urine: 1.02 (ref 1.005–1.030)
pH: 6.5 (ref 5.0–8.0)

## 2019-10-19 LAB — CBC WITH DIFFERENTIAL/PLATELET
Abs Immature Granulocytes: 0.04 10*3/uL (ref 0.00–0.07)
Basophils Absolute: 0.1 10*3/uL (ref 0.0–0.1)
Basophils Relative: 1 %
Eosinophils Absolute: 0.2 10*3/uL (ref 0.0–0.5)
Eosinophils Relative: 3 %
HCT: 43.4 % (ref 39.0–52.0)
Hemoglobin: 13.8 g/dL (ref 13.0–17.0)
Immature Granulocytes: 1 %
Lymphocytes Relative: 34 %
Lymphs Abs: 2.7 10*3/uL (ref 0.7–4.0)
MCH: 26.5 pg (ref 26.0–34.0)
MCHC: 31.8 g/dL (ref 30.0–36.0)
MCV: 83.5 fL (ref 80.0–100.0)
Monocytes Absolute: 0.8 10*3/uL (ref 0.1–1.0)
Monocytes Relative: 11 %
Neutro Abs: 4 10*3/uL (ref 1.7–7.7)
Neutrophils Relative %: 50 %
Platelets: 290 10*3/uL (ref 150–400)
RBC: 5.2 MIL/uL (ref 4.22–5.81)
RDW: 14.6 % (ref 11.5–15.5)
WBC: 7.8 10*3/uL (ref 4.0–10.5)
nRBC: 0 % (ref 0.0–0.2)

## 2019-10-19 LAB — COMPREHENSIVE METABOLIC PANEL
ALT: 29 U/L (ref 0–44)
AST: 23 U/L (ref 15–41)
Albumin: 3.9 g/dL (ref 3.5–5.0)
Alkaline Phosphatase: 105 U/L (ref 38–126)
Anion gap: 11 (ref 5–15)
BUN: 12 mg/dL (ref 6–20)
CO2: 25 mmol/L (ref 22–32)
Calcium: 8.3 mg/dL — ABNORMAL LOW (ref 8.9–10.3)
Chloride: 103 mmol/L (ref 98–111)
Creatinine, Ser: 0.97 mg/dL (ref 0.61–1.24)
GFR calc Af Amer: >60 mL/min (ref >60)
GFR calc non Af Amer: >60 mL/min (ref >60)
Glucose, Bld: 114 mg/dL — ABNORMAL HIGH (ref 70–99)
Potassium: 3.7 mmol/L (ref 3.5–5.1)
Sodium: 139 mmol/L (ref 135–145)
Total Bilirubin: 0.4 mg/dL (ref 0.3–1.2)
Total Protein: 7.8 g/dL (ref 6.5–8.1)

## 2019-10-19 LAB — TROPONIN I (HIGH SENSITIVITY)
Troponin I (High Sensitivity): <2 ng/L (ref <18)
Troponin I (High Sensitivity): 3 ng/L (ref <18)

## 2019-10-19 LAB — LIPASE, BLOOD: Lipase: 23 U/L (ref 11–51)

## 2019-10-19 LAB — TSH: TSH: 1.068 u[IU]/mL (ref 0.350–4.500)

## 2019-10-19 LAB — MAGNESIUM: Magnesium: 2.2 mg/dL (ref 1.7–2.4)

## 2019-10-19 LAB — CBG MONITORING, ED: Glucose-Capillary: 104 mg/dL — ABNORMAL HIGH (ref 70–99)

## 2019-10-19 MED ORDER — SODIUM CHLORIDE 0.9 % IV BOLUS
1000.0000 mL | Freq: Once | INTRAVENOUS | Status: AC
  Administered 2019-10-19: 1000 mL via INTRAVENOUS

## 2019-10-19 MED ORDER — LOPERAMIDE HCL 2 MG PO CAPS: 2.0000 mg | ORAL_CAPSULE | Freq: Four times a day (QID) | ORAL | 0 refills | Status: AC | PRN

## 2019-10-19 NOTE — ED Triage Notes (Signed)
Pt reports he is having palpitations since BP meds were changed. States his chest feels tight. Pt also reports loose stools x 2 weeks. He had a brief exposure to a friend who tested + for covid. States they had on masks and were social distancing at the time. He had a negative covid test this week and was tested again today prior to coming to the ED. NAD

## 2019-10-19 NOTE — ED Provider Notes (Signed)
[facility] EMERGENCY DEPARTMENT Provider Note   CSN: [id number] Arrival date & time: 10/19/19  1510     History   Chief Complaint Chief Complaint  Patient presents with   Palpitations    HPI Dean Hunt is a 43 y.o. male with history of hypertension, diabetes, OCD, anxiety, depression who presents with a 2-week history of diarrhea, 6-day history of shortness of breath, chest tightness.  He notes that the palpitations have been intermittent since he started amlodipine, so he wonders if this could be related to the medication.  He denies any bloody stools, but is having 2 episodes of diarrhea daily.  He denies any nausea, vomiting, abdominal pain, but has had some abdominal bloating.  He denies any abnormal foods or recent travel.  Denies any recent antibiotic use.  Patient tested negative for COVID-19 after a positive exposure last week and was tested again today, but does not have the results.     HPI  Past Medical History:  Diagnosis Date   Anxiety    Depression    Diabetes mellitus without complication (HCC)    History of methamphetamine use    Hypertension    Obsessive-compulsive disorder     There are no active problems to display for this patient.   Past Surgical History:  Procedure Laterality Date   TONSILLECTOMY     WISDOM TOOTH EXTRACTION          Home Medications    Prior to Admission medications   Medication Sig Start Date End Date Taking? Authorizing Provider  ALPRAZolam (XANAX XR) 2 MG 24 hr tablet Take 2 mg by mouth at bedtime.     [provider]  ALPRAZolam Prudy Feeler) 1 MG tablet Take 1 mg by mouth at bedtime as needed for anxiety.    [provider]  amLODipine (NORVASC) 5 MG tablet Take 5 mg by mouth daily.    [provider]  aspirin EC 81 MG tablet Take 81 mg by mouth daily.    [provider]  ciprofloxacin (CIPRO) 500 MG tablet Take 1 tablet (500 mg total) by mouth 2 (two) times daily.  12/12/18   Joy, Shawn C, PA-C  dicyclomine (BENTYL) 20 MG tablet Take 1 tablet (20 mg total) by mouth 2 (two) times daily. 12/12/18   Joy, Shawn C, PA-C  famotidine (PEPCID) 20 MG tablet Take 1 tablet (20 mg total) by mouth 2 (two) times daily. 04/15/18   Leshon Armistead, Waylan Boga, PA-C  glipiZIDE (GLUCOTROL) 5 MG tablet Take by mouth daily before breakfast.    [provider]  hydrochlorothiazide (HYDRODIURIL) 25 MG tablet Take 1 tablet (25 mg total) by mouth daily for 30 days. 04/03/19 05/03/19  Long, Arlyss Repress, MD  hydrocortisone (ANUSOL-HC) 2.5 % rectal cream Apply rectally 2 times daily 04/15/18   Wynonia Medero, Waylan Boga, PA-C  lidocaine (LIDODERM) 5 % Place 1 patch onto the skin daily. Remove & Discard patch within 12 hours or as directed by MD 12/12/18   Harolyn Rutherford C, PA-C  loperamide (IMODIUM) 2 MG capsule Take 1 capsule (2 mg total) by mouth 4 (four) times daily as needed for diarrhea or loose stools. 10/19/19   Anitria Andon, Waylan Boga, PA-C  meclizine (ANTIVERT) 25 MG tablet Take 1 tablet (25 mg total) by mouth 3 (three) times daily as needed for dizziness. 07/02/18   Melene Plan, DO  methocarbamol (ROBAXIN) 500 MG tablet Take 1 tablet (500 mg total) by mouth 2 (two) times daily. 12/12/18   Harolyn Rutherford  C, PA-C  METOPROLOL SUCCINATE ER PO Take 25 mg by mouth 2 (two) times daily.     [provider]  metroNIDAZOLE (FLAGYL) 500 MG tablet Take 1 tablet (500 mg total) by mouth 2 (two) times daily. 12/12/18   Joy, Shawn C, PA-C  omeprazole (PRILOSEC) 20 MG capsule Take 20 mg by mouth daily.    [provider]  ondansetron (ZOFRAN) 4 MG tablet Take 1 tablet (4 mg total) by mouth every 6 (six) hours. 04/15/18   Lurlean Kernen, Waylan Boga, PA-C  ondansetron (ZOFRAN-ODT) 4 MG disintegrating tablet Take 1 tablet (4 mg total) by mouth every 8 (eight) hours as needed for nausea or vomiting. 01/13/19   Benjiman Core, MD  Vitamin D, Ergocalciferol, (DRISDOL) 50000 units CAPS capsule Take 50,000 Units by mouth every 7  (seven) days.    [provider]    Family History No family history on file.  Social History Social History   Tobacco Use   Smoking status: Current Every Day Smoker    Packs/day: 0.50    Types: Cigarettes   Smokeless tobacco: Never Used  Substance Use Topics   Alcohol use: Yes    Comment: occ   Drug use: Not Currently     Allergies   Compazine [prochlorperazine edisylate] and Penicillins   Review of Systems Review of Systems  Constitutional: Negative for chills and fever.  HENT: Negative for facial swelling and sore throat.   Respiratory: Positive for chest tightness and shortness of breath. Negative for cough.   Cardiovascular: Negative for chest pain.  Gastrointestinal: Positive for diarrhea. Negative for abdominal pain, nausea and vomiting.  Genitourinary: Negative for dysuria.  Musculoskeletal: Negative for back pain.  Skin: Negative for rash and wound.  Neurological: Negative for headaches.  Psychiatric/Behavioral: The patient is not nervous/anxious.      Physical Exam Updated Vital Signs BP (!) 144/86    Pulse 70    Temp 99.4 F (37.4 C) (Oral)    Resp (!) 21    Ht  (1.803 m)    Wt (!) 138.3 kg    SpO2 100%    BMI 42.54 kg/m   Physical Exam Vitals signs and nursing note reviewed.  Constitutional:      General: He is not in acute distress.    Appearance: He is well-developed. He is not diaphoretic.  HENT:     Head: Normocephalic and atraumatic.     Right Ear: Tympanic membrane normal.     Left Ear: Tympanic membrane normal.     Mouth/Throat:     Pharynx: No oropharyngeal exudate.  Eyes:     General: No scleral icterus.       Right eye: No discharge.        Left eye: No discharge.     Conjunctiva/sclera: Conjunctivae normal.     Pupils: Pupils are equal, round, and reactive to light.  Neck:     Musculoskeletal: Normal range of motion and neck supple.     Thyroid: No thyromegaly.  Cardiovascular:     Rate and Rhythm: Normal  rate and regular rhythm.     Heart sounds: Normal heart sounds. No murmur. No friction rub. No gallop.   Pulmonary:     Effort: Pulmonary effort is normal. No respiratory distress.     Breath sounds: Normal breath sounds. No stridor. No wheezing or rales.  Abdominal:     General: Bowel sounds are normal. There is no distension.     Palpations: Abdomen is soft.  Tenderness: There is no abdominal tenderness. There is no guarding or rebound.  Lymphadenopathy:     Cervical: No cervical adenopathy.  Skin:    General: Skin is warm and dry.     Coloration: Skin is not pale.     Findings: No rash.  Neurological:     Mental Status: He is alert.     Coordination: Coordination normal.      ED Treatments / Results  Labs (all labs ordered are listed, but only abnormal results are displayed) Labs Reviewed  COMPREHENSIVE METABOLIC PANEL - Abnormal; Notable for the following components:      Result Value   Glucose, Bld 114 (*)    Calcium 8.3 (*)    All other components within normal limits  CBG MONITORING, ED - Abnormal; Notable for the following components:   Glucose-Capillary 104 (*)    All other components within normal limits  GASTROINTESTINAL PANEL BY PCR, STOOL (REPLACES STOOL CULTURE)  C DIFFICILE QUICK SCREEN W PCR REFLEX  CBC WITH DIFFERENTIAL/PLATELET  LIPASE, BLOOD  URINALYSIS, ROUTINE W REFLEX MICROSCOPIC  MAGNESIUM  TSH  TROPONIN I (HIGH SENSITIVITY)  TROPONIN I (HIGH SENSITIVITY)    EKG EKG Interpretation  Date/Time:  Saturday October 19 2019 15:33:13 EST Ventricular Rate:  88 PR Interval:  168 QRS Duration: 98 QT Interval:  350 QTC Calculation: 423 R Axis:   -22 Text Interpretation: Normal sinus rhythm Normal ECG since last tracing no significant change Confirmed by Malvin Johns [phone]) on 10/19/2019 4:13:11 PM   Radiology Dg Chest Portable 1 View  Result Date: 10/19/2019 CLINICAL DATA:  Chest pain shortness of breath. EXAM: PORTABLE CHEST 1 VIEW  COMPARISON:  Chest radiograph 05/15/2019 FINDINGS: Monitoring leads overlie the patient. Stable cardiomegaly. No large area pulmonary consolidation. No pleural effusion or pneumothorax. IMPRESSION: No acute cardiopulmonary process. Electronically Signed   By: Lovey Newcomer M.D.   On: 10/19/2019 18:01    Procedures Procedures (including critical care time)  Medications Ordered in ED Medications  sodium chloride 0.9 % bolus 1,000 mL ( Intravenous Stopped 10/19/19 1833)     Initial Impression / Assessment and Plan / ED Course  I have reviewed the triage vital signs and the nursing notes.  Pertinent labs & imaging results that were available during my care of the patient were reviewed by me and considered in my medical decision making (see chart for details).        Patient presenting with multiple complaints including intermittent palpitations, chest pain, diarrhea.  Labs are largely unremarkable.  Chest x-ray is clear patient's abdominal exam is soft and nontender.  No indication for further imaging at this time.  EKG shows NSR, no significant change from last tracing.  Reports he may be had palpitations from amlodipine as he started 6 weeks ago.  I advised him to discuss this with his doctor and possibly change this medication, as this is a possibility.  Will discharge home with Imodium for diarrhea.  Follow-up to PCP if the symptoms are continuing.  Return precautions discussed.  Patient understands and agrees with plan.  Patient vitals stable throughout ED course and discharged in satisfactory condition.  Final Clinical Impressions(s) / ED Diagnoses   Final diagnoses:  Palpitations  Diarrhea, unspecified type    ED Discharge Orders         Ordered    loperamide (IMODIUM) 2 MG capsule  4 times daily PRN     10/19/19 2035  Emi HolesLaw, Lyniah Fujita M, PA-C 10/19/19 2248    Rolan BuccoBelfi, Melanie, MD 10/19/19 2333

## 2019-10-19 NOTE — ED Notes (Signed)
Pt requesting to have his CBG checked.

## 2019-10-19 NOTE — Discharge Instructions (Signed)
Take Imodium up to 4 times daily as needed for diarrhea.  Please follow-up with your doctor next week for further evaluation and treatment of your symptoms.  You may want to talk to them about wearing a Holter monitor if your palpitations are recurring.  Please have your doctor follow-up with your thyroid hormone, as it is still pending today.

## 2019-12-27 ENCOUNTER — Emergency Department (HOSPITAL_BASED_OUTPATIENT_CLINIC_OR_DEPARTMENT_OTHER)
Admission: EM | Admit: 2019-12-27 | Discharge: 2019-12-27 | Disposition: A | Discharge status: Home / Self Care | Payer: BLUE CROSS/BLUE SHIELD | Attending: Emergency Medicine | Admitting: Emergency Medicine

## 2019-12-27 ENCOUNTER — Other Ambulatory Visit: Payer: Self-pay

## 2019-12-27 ENCOUNTER — Encounter (HOSPITAL_BASED_OUTPATIENT_CLINIC_OR_DEPARTMENT_OTHER): Payer: Self-pay | Admitting: *Deleted

## 2019-12-27 DIAGNOSIS — Z7982 Long term (current) use of aspirin: Secondary | ICD-10-CM | POA: Diagnosis not present

## 2019-12-27 DIAGNOSIS — Z79899 Other long term (current) drug therapy: Secondary | ICD-10-CM | POA: Insufficient documentation

## 2019-12-27 DIAGNOSIS — E119 Type 2 diabetes mellitus without complications: Secondary | ICD-10-CM | POA: Insufficient documentation

## 2019-12-27 DIAGNOSIS — F17210 Nicotine dependence, cigarettes, uncomplicated: Secondary | ICD-10-CM | POA: Diagnosis not present

## 2019-12-27 DIAGNOSIS — R42 Dizziness and giddiness: Secondary | ICD-10-CM | POA: Diagnosis not present

## 2019-12-27 DIAGNOSIS — I10 Essential (primary) hypertension: Secondary | ICD-10-CM | POA: Diagnosis not present

## 2019-12-27 LAB — TROPONIN I (HIGH SENSITIVITY): Troponin I (High Sensitivity): 2 ng/L (ref <18)

## 2019-12-27 LAB — BASIC METABOLIC PANEL
Anion gap: 8 (ref 5–15)
BUN: 14 mg/dL (ref 6–20)
CO2: 23 mmol/L (ref 22–32)
Calcium: 8.7 mg/dL — ABNORMAL LOW (ref 8.9–10.3)
Chloride: 105 mmol/L (ref 98–111)
Creatinine, Ser: 1.01 mg/dL (ref 0.61–1.24)
GFR calc Af Amer: >60 mL/min (ref >60)
GFR calc non Af Amer: >60 mL/min (ref >60)
Glucose, Bld: 163 mg/dL — ABNORMAL HIGH (ref 70–99)
Potassium: 3.9 mmol/L (ref 3.5–5.1)
Sodium: 136 mmol/L (ref 135–145)

## 2019-12-27 LAB — CBC WITH DIFFERENTIAL/PLATELET
Abs Immature Granulocytes: 0.05 10*3/uL (ref 0.00–0.07)
Basophils Absolute: 0.1 10*3/uL (ref 0.0–0.1)
Basophils Relative: 1 %
Eosinophils Absolute: 0.3 10*3/uL (ref 0.0–0.5)
Eosinophils Relative: 4 %
HCT: 45.8 % (ref 39.0–52.0)
Hemoglobin: 14.4 g/dL (ref 13.0–17.0)
Immature Granulocytes: 1 %
Lymphocytes Relative: 43 %
Lymphs Abs: 3.7 10*3/uL (ref 0.7–4.0)
MCH: 26.5 pg (ref 26.0–34.0)
MCHC: 31.4 g/dL (ref 30.0–36.0)
MCV: 84.2 fL (ref 80.0–100.0)
Monocytes Absolute: 0.9 10*3/uL (ref 0.1–1.0)
Monocytes Relative: 11 %
Neutro Abs: 3.4 10*3/uL (ref 1.7–7.7)
Neutrophils Relative %: 40 %
Platelets: 302 10*3/uL (ref 150–400)
RBC: 5.44 MIL/uL (ref 4.22–5.81)
RDW: 15 % (ref 11.5–15.5)
WBC: 8.5 10*3/uL (ref 4.0–10.5)
nRBC: 0 % (ref 0.0–0.2)

## 2019-12-27 LAB — TSH: TSH: 1.775 u[IU]/mL (ref 0.350–4.500)

## 2019-12-27 MED ORDER — ALUM & MAG HYDROXIDE-SIMETH 200-200-20 MG/5ML PO SUSP
30.0000 mL | Freq: Once | ORAL | Status: AC
  Administered 2019-12-27: 12:00:00 30 mL via ORAL
  Filled 2019-12-27: qty 30

## 2019-12-27 NOTE — ED Provider Notes (Signed)
[facility] EMERGENCY DEPARTMENT Provider Note   CSN: [id number] Arrival date & time: 12/27/19  1104     History Chief Complaint  Patient presents with  . Dizziness    Dean Hunt is a 44 y.o. male.  44 yo M with a chief complaint feeling lightheaded.  Patient states that for the past couple days he has felt this way off and on.  He went to a restaurant to get some food and he felt like he was going to pass out had to sit down in a chair and eventually felt better went back to his hotel where he is staying and had EMS come.  They monitored him for a couple hours and then they decided together not to come to the hospital.  Patient states since then he has felt a little bit dizzy.  He has difficulty describing this.  Worse when he turns his head.  Denies cough congestion or fever denies chest pain denies shortness of breath denies headache or neck pain.  He has had a feeling like maybe he is nauseated.  The history is provided by the patient.  Dizziness Associated symptoms: no chest pain, no diarrhea, no headaches, no palpitations, no shortness of breath and no vomiting   Illness Severity:  Moderate Onset quality:  Gradual Duration:  2 days Timing:  Intermittent Progression:  Waxing and waning Chronicity:  New Associated symptoms: no abdominal pain, no chest pain, no congestion, no diarrhea, no fever, no headaches, no myalgias, no rash, no shortness of breath and no vomiting        Past Medical History:  Diagnosis Date  . Anxiety   . Depression   . Diabetes mellitus without complication ([place])   . History of methamphetamine use   . Hypertension   . Obsessive-compulsive disorder     There are no problems to display for this patient.   Past Surgical History:  Procedure Laterality Date  . TONSILLECTOMY    . WISDOM TOOTH EXTRACTION         No family history on file.  Social History   Tobacco Use  . Smoking status: Current Every Day Smoker    Packs/day:  0.50    Types: Cigarettes  . Smokeless tobacco: Never Used  Substance Use Topics  . Alcohol use: Yes    Comment: occ  . Drug use: Not Currently    Home Medications Prior to Admission medications   Medication Sig Start Date End Date Taking? Authorizing Provider  ALPRAZolam (XANAX XR) 2 MG 24 hr tablet Take by mouth. 03/31/17  Yes [provider]  ALPRAZolam Duanne Moron) 1 MG tablet Take 1/2 to 1 tab 2 times a day 03/31/17  Yes [provider]  losartan (COZAAR) 25 MG tablet Take by mouth. 12/10/19  Yes [provider]  ALPRAZolam (XANAX XR) 2 MG 24 hr tablet Take 2 mg by mouth at bedtime.     [provider]  ALPRAZolam Duanne Moron) 1 MG tablet Take 1 mg by mouth at bedtime as needed for anxiety.    [provider]  amLODipine (NORVASC) 5 MG tablet Take 5 mg by mouth daily.    [provider]  aspirin EC 81 MG tablet Take 81 mg by mouth daily.    [provider]  ciprofloxacin (CIPRO) 500 MG tablet Take 1 tablet (500 mg total) by mouth 2 (two) times daily. 12/12/18   Joy, Shawn C, PA-C  dicyclomine (BENTYL) 20 MG tablet Take 1 tablet (20 mg total)  by mouth 2 (two) times daily. 12/12/18   Joy, Shawn C, PA-C  famotidine (PEPCID) 20 MG tablet Take 1 tablet (20 mg total) by mouth 2 (two) times daily. 04/15/18   Law, Waylan Boga, PA-C  glipiZIDE (GLUCOTROL) 5 MG tablet Take by mouth daily before breakfast.    [provider]  hydrochlorothiazide (HYDRODIURIL) 25 MG tablet Take 1 tablet (25 mg total) by mouth daily for 30 days. 04/03/19 05/03/19  Long, Arlyss Repress, MD  hydrocortisone (ANUSOL-HC) 2.5 % rectal cream Apply rectally 2 times daily 04/15/18   Law, Waylan Boga, PA-C  lidocaine (LIDODERM) 5 % Place 1 patch onto the skin daily. Remove & Discard patch within 12 hours or as directed by MD 12/12/18   Harolyn Rutherford C, PA-C  loperamide (IMODIUM) 2 MG capsule Take 1 capsule (2 mg total) by mouth 4 (four) times daily as needed for diarrhea or loose  stools. 10/19/19   Law, Waylan Boga, PA-C  meclizine (ANTIVERT) 25 MG tablet Take 1 tablet (25 mg total) by mouth 3 (three) times daily as needed for dizziness. 07/02/18   Melene Plan, DO  methocarbamol (ROBAXIN) 500 MG tablet Take 1 tablet (500 mg total) by mouth 2 (two) times daily. 12/12/18   Joy, Shawn C, PA-C  METOPROLOL SUCCINATE ER PO Take 25 mg by mouth 2 (two) times daily.     [provider]  metroNIDAZOLE (FLAGYL) 500 MG tablet Take 1 tablet (500 mg total) by mouth 2 (two) times daily. 12/12/18   Joy, Shawn C, PA-C  omeprazole (PRILOSEC) 20 MG capsule Take 20 mg by mouth daily.    [provider]  ondansetron (ZOFRAN) 4 MG tablet Take 1 tablet (4 mg total) by mouth every 6 (six) hours. 04/15/18   Law, Waylan Boga, PA-C  ondansetron (ZOFRAN-ODT) 4 MG disintegrating tablet Take 1 tablet (4 mg total) by mouth every 8 (eight) hours as needed for nausea or vomiting. 01/13/19   Benjiman Core, MD  Vitamin D, Ergocalciferol, (DRISDOL) 50000 units CAPS capsule Take 50,000 Units by mouth every 7 (seven) days.    [provider]    Allergies    Compazine [prochlorperazine edisylate] and Penicillins  Review of Systems   Review of Systems  Constitutional: Negative for chills and fever.  HENT: Negative for congestion and facial swelling.   Eyes: Negative for discharge and visual disturbance.  Respiratory: Negative for shortness of breath.   Cardiovascular: Negative for chest pain and palpitations.  Gastrointestinal: Negative for abdominal pain, diarrhea and vomiting.  Musculoskeletal: Negative for arthralgias and myalgias.  Skin: Negative for color change and rash.  Neurological: Positive for dizziness. Negative for tremors, syncope and headaches.  Psychiatric/Behavioral: Negative for confusion and dysphoric mood.    Physical Exam Updated Vital Signs BP (!) 144/105   Pulse 84   Temp 98.6 F (37 C) (Oral)   Resp 12   Ht 5\' 11"  (1.803 m)   Wt (!) 142.9 kg    SpO2 100%   BMI 43.93 kg/m   Physical Exam Vitals and nursing note reviewed.  Constitutional:      Appearance: He is well-developed.  HENT:     Head: Normocephalic and atraumatic.  Eyes:     Pupils: Pupils are equal, round, and reactive to light.  Neck:     Vascular: No JVD.  Cardiovascular:     Rate and Rhythm: Normal rate and regular rhythm.     Heart sounds: No murmur. No friction rub. No gallop.   Pulmonary:  Effort: No respiratory distress.     Breath sounds: No wheezing.  Abdominal:     General: There is no distension.     Tenderness: There is no abdominal tenderness. There is no guarding or rebound.  Musculoskeletal:        General: Normal range of motion.     Cervical back: Normal range of motion and neck supple.  Skin:    Coloration: Skin is not pale.     Findings: No rash.  Neurological:     Mental Status: He is alert and oriented to person, place, and time.     Cranial Nerves: Cranial nerves are intact.     Sensory: Sensation is intact.     Motor: Motor function is intact.     Coordination: Coordination is intact.     Gait: Gait is intact.     Comments: Left-sided fast going nystagmus.  Left TM has some earwax is pushed up against the tympanic membrane.  Psychiatric:        Behavior: Behavior normal.     ED Results / Procedures / Treatments   Labs (all labs ordered are listed, but only abnormal results are displayed) Labs Reviewed  BASIC METABOLIC PANEL - Abnormal; Notable for the following components:      Result Value   Glucose, Bld 163 (*)    Calcium 8.7 (*)    All other components within normal limits  CBC WITH DIFFERENTIAL/PLATELET  TSH  TROPONIN I (HIGH SENSITIVITY)    EKG EKG Interpretation  Date/Time:  Friday December 27 2019 11:22:55 EST Ventricular Rate:  91 PR Interval:    QRS Duration: 104 QT Interval:  350 QTC Calculation: 431 R Axis:   -5 Text Interpretation: Sinus rhythm Low voltage, precordial leads Baseline wander in  lead(s) I V5 V6 no wpw, prolonged qt or brugada TECHNICALLY DIFFICULT Otherwise no significant change Confirmed by Melene Plan [phone]) on 12/27/2019 11:40:29 AM   Radiology No results found.  Procedures Procedures (including critical care time)  Medications Ordered in ED Medications  alum & mag hydroxide-simeth (MAALOX/MYLANTA) 200-200-20 MG/5ML suspension 30 mL (30 mLs Oral Given 12/27/19 1221)    ED Course  I have reviewed the triage vital signs and the nursing notes.  Pertinent labs & imaging results that were available during my care of the patient were reviewed by me and considered in my medical decision making (see chart for details).    MDM Rules/Calculators/A&P                      44 yo M with a chief complaints of dizziness.  Sounds like this is positional.  Localized to the left side with fast going nystagmus on exam.  He does have some earwax that is been pushed up against the eardrum.  Could be the source of his symptoms.  We will have it irrigated at bedside by nursing.  He has a benign neurologic exam for me.  He has been complaining that his blood pressures been elevated has been checking it frequently.  He unfortunately has a history of obsessive-compulsive disorder and has recently been checking his blood pressure frequently over the past 48 hours.  This may be the cause of his hypertension.  Complaining of palpitations and normal sinus rhythm here.  Will obtain some basic blood work.  EKG.  Reassess.  Patient symptoms are essentially unchanged.  Earwax was removed from the hepatic membrane and verified by me.  Work-up here is unremarkable.  EKG  without concerning finding no significant electrolyte abnormality no significant anemia.  Primary is negative.  Will discharge home.  PCP follow-up.  12:48 PM:  I have discussed the diagnosis/risks/treatment options with the patient and believe the pt to be eligible for discharge home to follow-up with PCP. We also discussed returning  to the ED immediately if new or worsening sx occur. We discussed the sx which are most concerning (e.g., sudden worsening pain, fever, inability to tolerate by mouth) that necessitate immediate return. Medications administered to the patient during their visit and any new prescriptions provided to the patient are listed below.  Medications given during this visit Medications  alum & mag hydroxide-simeth (MAALOX/MYLANTA) 200-200-20 MG/5ML suspension 30 mL (30 mLs Oral Given 12/27/19 1221)     The patient appears reasonably screen and/or stabilized for discharge and I doubt any other medical condition or other [facility] requiring further screening, evaluation, or treatment in the ED at this time prior to discharge.   Final Clinical Impression(s) / ED Diagnoses Final diagnoses:  Light-headed feeling  Essential hypertension    Rx / DC Orders ED Discharge Orders    None       Melene Plan, DO 12/27/19 1248

## 2019-12-27 NOTE — Discharge Instructions (Signed)
Take a break from taking her blood pressure at home.  Call your doctor on Monday and discuss your symptoms and see when they want to see you in the office.  Please return to the ED for worsening dizziness one-sided weakness or numbness difficulty speaking or swallowing.  Try not to put anything into your ear that is smaller than your finger.  Continue to take your blood pressure medicines as prescribed.

## 2019-12-27 NOTE — ED Triage Notes (Addendum)
States for the past few days his heart rate and BP has been elevated. 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 He had EMS come 2 days ago and he refused transport at that time. Hx of anxiety and he feels anxious.

## 2020-05-08 ENCOUNTER — Emergency Department (HOSPITAL_BASED_OUTPATIENT_CLINIC_OR_DEPARTMENT_OTHER)
Admission: EM | Admit: 2020-05-08 | Discharge: 2020-05-08 | Disposition: A | Discharge status: Home / Self Care | Payer: BLUE CROSS/BLUE SHIELD | Attending: Emergency Medicine | Admitting: Emergency Medicine

## 2020-05-08 ENCOUNTER — Other Ambulatory Visit: Payer: Self-pay

## 2020-05-08 ENCOUNTER — Encounter (HOSPITAL_BASED_OUTPATIENT_CLINIC_OR_DEPARTMENT_OTHER): Payer: Self-pay

## 2020-05-08 ENCOUNTER — Emergency Department (HOSPITAL_BASED_OUTPATIENT_CLINIC_OR_DEPARTMENT_OTHER): Payer: BLUE CROSS/BLUE SHIELD

## 2020-05-08 DIAGNOSIS — E1165 Type 2 diabetes mellitus with hyperglycemia: Secondary | ICD-10-CM | POA: Diagnosis not present

## 2020-05-08 DIAGNOSIS — Z7984 Long term (current) use of oral hypoglycemic drugs: Secondary | ICD-10-CM | POA: Diagnosis not present

## 2020-05-08 DIAGNOSIS — F17210 Nicotine dependence, cigarettes, uncomplicated: Secondary | ICD-10-CM | POA: Insufficient documentation

## 2020-05-08 DIAGNOSIS — R42 Dizziness and giddiness: Secondary | ICD-10-CM

## 2020-05-08 DIAGNOSIS — I10 Essential (primary) hypertension: Secondary | ICD-10-CM | POA: Insufficient documentation

## 2020-05-08 DIAGNOSIS — Z79899 Other long term (current) drug therapy: Secondary | ICD-10-CM | POA: Insufficient documentation

## 2020-05-08 DIAGNOSIS — Z880 Allergy status to penicillin: Secondary | ICD-10-CM | POA: Diagnosis not present

## 2020-05-08 DIAGNOSIS — Z7982 Long term (current) use of aspirin: Secondary | ICD-10-CM | POA: Insufficient documentation

## 2020-05-08 DIAGNOSIS — R0789 Other chest pain: Secondary | ICD-10-CM | POA: Diagnosis present

## 2020-05-08 LAB — BASIC METABOLIC PANEL
Anion gap: 11 (ref 5–15)
BUN: 11 mg/dL (ref 6–20)
CO2: 22 mmol/L (ref 22–32)
Calcium: 8.6 mg/dL — ABNORMAL LOW (ref 8.9–10.3)
Chloride: 102 mmol/L (ref 98–111)
Creatinine, Ser: 0.9 mg/dL (ref 0.61–1.24)
GFR calc Af Amer: >60 mL/min (ref >60)
GFR calc non Af Amer: >60 mL/min (ref >60)
Glucose, Bld: 182 mg/dL — ABNORMAL HIGH (ref 70–99)
Potassium: 3.5 mmol/L (ref 3.5–5.1)
Sodium: 135 mmol/L (ref 135–145)

## 2020-05-08 LAB — CBC
HCT: 43.3 % (ref 39.0–52.0)
Hemoglobin: 14 g/dL (ref 13.0–17.0)
MCH: 27.2 pg (ref 26.0–34.0)
MCHC: 32.3 g/dL (ref 30.0–36.0)
MCV: 84.2 fL (ref 80.0–100.0)
Platelets: 300 10*3/uL (ref 150–400)
RBC: 5.14 MIL/uL (ref 4.22–5.81)
RDW: 14.6 % (ref 11.5–15.5)
WBC: 9.2 10*3/uL (ref 4.0–10.5)
nRBC: 0 % (ref 0.0–0.2)

## 2020-05-08 LAB — TROPONIN I (HIGH SENSITIVITY)
Troponin I (High Sensitivity): <2 ng/L (ref <18)
Troponin I (High Sensitivity): 3 ng/L (ref <18)

## 2020-05-08 LAB — D-DIMER, QUANTITATIVE: D-Dimer, Quant: 0.43 ug/mL-FEU (ref 0.00–0.50)

## 2020-05-08 LAB — CBG MONITORING, ED: Glucose-Capillary: 185 mg/dL — ABNORMAL HIGH (ref 70–99)

## 2020-05-08 MED ORDER — SODIUM CHLORIDE 0.9 % IV BOLUS
500.0000 mL | Freq: Once | INTRAVENOUS | Status: AC
  Administered 2020-05-08: 500 mL via INTRAVENOUS

## 2020-05-08 MED ORDER — ONDANSETRON HCL 4 MG/2ML IJ SOLN
4.0000 mg | Freq: Once | INTRAMUSCULAR | Status: AC
  Administered 2020-05-08: 4 mg via INTRAVENOUS
  Filled 2020-05-08: qty 2

## 2020-05-08 MED ORDER — SODIUM CHLORIDE 0.9% FLUSH
3.0000 mL | Freq: Once | INTRAVENOUS | Status: DC
  Filled 2020-05-08: qty 3

## 2020-05-08 NOTE — Discharge Instructions (Signed)
The cause of your symptoms was not identified today.  Please follow up with your family doctor for further evaluation.   ?

## 2020-05-08 NOTE — ED Triage Notes (Addendum)
Pt c/o feeling light headed, blurred vision and heart racing started ~10am-BS was 200-c/o "uncomfortable in the chest" when asked pain site-started at ~10am as well-states he checked his BS 234 at ~130pm-NAD-steady gait

## 2020-05-08 NOTE — ED Provider Notes (Signed)
[facility] EMERGENCY DEPARTMENT Provider Note   CSN: [id number] Arrival date & time: 05/08/20  1442     History Chief Complaint  Patient presents with  . Hyperglycemia  . Chest Pain    Dean Hunt is a 43 y.o. male.  The history is provided by the patient and medical records. No language interpreter was used.  Hyperglycemia Associated symptoms: chest pain   Chest Pain  Dean Hunt is a 44 y.o. male who presents to the Emergency Department complaining of vision changes.  He was giving a presentation today and developed difficulty seeing/blurred vision.  BS elevated to 200s.  Later had blood pressure checked and it was elevated (144/104).  Sxs started around 1015 this morning.  He has associated nausea and chest discomfort (does not describe as pain).  Sxs are constant.  Has abdominal discomfort, dry mouth.  Chest discomfort is worse with deep breaths, no sob.   Denies fever, vomiting, diarrhea, HA, neck pain, leg edema. .   No hx DVT, heart disease.  Smokes tobacco, social drinker.      Past Medical History:  Diagnosis Date  . Anxiety   . Depression   . Diabetes mellitus without complication (HCC)   . History of methamphetamine use   . Hypertension   . Obsessive-compulsive disorder     There are no problems to display for this patient.   Past Surgical History:  Procedure Laterality Date  . TONSILLECTOMY    . WISDOM TOOTH EXTRACTION         No family history on file.  Social History   Tobacco Use  . Smoking status: Current Every Day Smoker    Packs/day: 0.50    Types: Cigarettes  . Smokeless tobacco: Never Used  Vaping Use  . Vaping Use: Never used  Substance Use Topics  . Alcohol use: Yes    Comment: occ  . Drug use: Not Currently    Home Medications Prior to Admission medications   Medication Sig Start Date End Date Taking? Authorizing Provider  ALPRAZolam (XANAX XR) 2 MG 24 hr tablet Take 2 mg by mouth at bedtime.     [provider]  ALPRAZolam (XANAX XR) 2 MG 24 hr tablet Take by mouth. 03/31/17   [provider]  ALPRAZolam Prudy Feeler) 1 MG tablet Take 1 mg by mouth at bedtime as needed for anxiety.    [provider]  ALPRAZolam Prudy Feeler) 1 MG tablet Take 1/2 to 1 tab 2 times a day 03/31/17   [provider]  amLODipine (NORVASC) 5 MG tablet Take 5 mg by mouth daily.    [provider]  aspirin EC 81 MG tablet Take 81 mg by mouth daily.    [provider]  ciprofloxacin (CIPRO) 500 MG tablet Take 1 tablet (500 mg total) by mouth 2 (two) times daily. 12/12/18   Joy, Shawn C, PA-C  dicyclomine (BENTYL) 20 MG tablet Take 1 tablet (20 mg total) by mouth 2 (two) times daily. 12/12/18   Joy, Shawn C, PA-C  famotidine (PEPCID) 20 MG tablet Take 1 tablet (20 mg total) by mouth 2 (two) times daily. 04/15/18   Law, Waylan Boga, PA-C  glipiZIDE (GLUCOTROL) 5 MG tablet Take by mouth daily before breakfast.    [provider]  hydrochlorothiazide (HYDRODIURIL) 25 MG tablet Take 1 tablet (25 mg total) by mouth daily for 30 days. 04/03/19 05/03/19  Long, Arlyss Repress, MD  hydrocortisone (ANUSOL-HC) 2.5 % rectal cream Apply rectally 2 times  daily 04/15/18   Emi Holes, PA-C  lidocaine (LIDODERM) 5 % Place 1 patch onto the skin daily. Remove & Discard patch within 12 hours or as directed by MD 12/12/18   Harolyn Rutherford C, PA-C  loperamide (IMODIUM) 2 MG capsule Take 1 capsule (2 mg total) by mouth 4 (four) times daily as needed for diarrhea or loose stools. 10/19/19   Law, Waylan Boga, PA-C  losartan (COZAAR) 25 MG tablet Take by mouth. 12/10/19   [provider]  meclizine (ANTIVERT) 25 MG tablet Take 1 tablet (25 mg total) by mouth 3 (three) times daily as needed for dizziness. 07/02/18   Melene Plan, DO  methocarbamol (ROBAXIN) 500 MG tablet Take 1 tablet (500 mg total) by mouth 2 (two) times daily. 12/12/18   Joy, Shawn C, PA-C  METOPROLOL SUCCINATE ER PO Take 25 mg by mouth 2  (two) times daily.     [provider]  metroNIDAZOLE (FLAGYL) 500 MG tablet Take 1 tablet (500 mg total) by mouth 2 (two) times daily. 12/12/18   Joy, Shawn C, PA-C  omeprazole (PRILOSEC) 20 MG capsule Take 20 mg by mouth daily.    [provider]  ondansetron (ZOFRAN) 4 MG tablet Take 1 tablet (4 mg total) by mouth every 6 (six) hours. 04/15/18   Law, Waylan Boga, PA-C  ondansetron (ZOFRAN-ODT) 4 MG disintegrating tablet Take 1 tablet (4 mg total) by mouth every 8 (eight) hours as needed for nausea or vomiting. 01/13/19   Benjiman Core, MD  Vitamin D, Ergocalciferol, (DRISDOL) 50000 units CAPS capsule Take 50,000 Units by mouth every 7 (seven) days.    [provider]    Allergies    Compazine [prochlorperazine edisylate] and Penicillins  Review of Systems   Review of Systems  Cardiovascular: Positive for chest pain.  All other systems reviewed and are negative.   Physical Exam Updated Vital Signs BP 124/77 (BP Location: Right Arm)   Pulse 70   Temp 99.2 F (37.3 C) (Oral)   Resp 16   Ht 5\' 11"  (1.803 m)   Wt (!) 147.9 kg   SpO2 96%   BMI 45.48 kg/m   Physical Exam Vitals and nursing note reviewed.  Constitutional:      Appearance: He is well-developed.  HENT:     Head: Normocephalic and atraumatic.  Eyes:     Extraocular Movements: Extraocular movements intact.     Pupils: Pupils are equal, round, and reactive to light.  Cardiovascular:     Rate and Rhythm: Normal rate and regular rhythm.     Heart sounds: No murmur heard.   Pulmonary:     Effort: Pulmonary effort is normal. No respiratory distress.     Breath sounds: Normal breath sounds.  Abdominal:     Palpations: Abdomen is soft.     Tenderness: There is no abdominal tenderness. There is no guarding or rebound.  Musculoskeletal:        General: No swelling or tenderness.     Comments: 2+ DP pulses bilaterally  Skin:    General: Skin is warm and dry.  Neurological:     Mental  Status: He is alert and oriented to person, place, and time.     Comments: Visual fields grossly intact.  No pronator drift.  5/5 strength in all four extremities with sensation to light touch intact in all four extremities  Psychiatric:        Behavior: Behavior normal.     ED Results / Procedures /  Treatments   Labs (all labs ordered are listed, but only abnormal results are displayed) Labs Reviewed  BASIC METABOLIC PANEL - Abnormal; Notable for the following components:      Result Value   Glucose, Bld 182 (*)    Calcium 8.6 (*)    All other components within normal limits  CBG MONITORING, ED - Abnormal; Notable for the following components:   Glucose-Capillary 185 (*)    All other components within normal limits  CBC  D-DIMER, QUANTITATIVE (NOT AT [facility])  CBG MONITORING, ED  TROPONIN I (HIGH SENSITIVITY)  TROPONIN I (HIGH SENSITIVITY)    EKG EKG Interpretation  Date/Time:  Friday May 08 2020 14:51:05 EDT Ventricular Rate:  99 PR Interval:  150 QRS Duration: 92 QT Interval:  348 QTC Calculation: 446 R Axis:   6 Text Interpretation: Normal sinus rhythm Cannot rule out Anterior infarct , age undetermined Abnormal ECG No significant change since last tracing Confirmed by Quintella Reichert [phone]) on 05/08/2020 4:09:48 PM   Radiology DG Chest 2 View  Result Date: 05/08/2020 CLINICAL DATA:  Lightheaded with blurred vision. EXAM: CHEST - 2 VIEW COMPARISON:  February 23, 2020 FINDINGS: There is no evidence of acute infiltrate, pleural effusion or pneumothorax. There is mild elevation of the left hemidiaphragm. The heart size and mediastinal contours are within normal limits. Degenerative changes seen within the mid to lower thoracic spine. IMPRESSION: No active cardiopulmonary disease. Electronically Signed   By: Virgina Norfolk M.D.   On: 05/08/2020 15:50    Procedures Procedures (including critical care time)  Medications Ordered in ED Medications  sodium chloride flush  (NS) 0.9 % injection 3 mL (3 mLs Intravenous Not Given 05/08/20 1605)  sodium chloride 0.9 % bolus 500 mL ( Intravenous Stopped 05/08/20 1805)  ondansetron (ZOFRAN) injection 4 mg (4 mg Intravenous Given 05/08/20 1647)    ED Course  I have reviewed the triage vital signs and the nursing notes.  Pertinent labs & imaging results that were available during my care of the patient were reviewed by me and considered in my medical decision making (see chart for details).    MDM Rules/Calculators/A&P                         Patient here for evaluation of lightheadedness, chest discomfort, nausea that started earlier today. He is non-toxic appearing on evaluation with no focal neurologic deficits. Labs with mild hyperglycemia, otherwise no significant abnormalities. D dimer is negative, doubt DVT or PE. Troponin is negative times two, doubt ACS. Presentation is not consistent with CVA, ACS, dissection. Discussed with patient unclear source of symptoms. Discussed outpatient follow-up as well as return precautions.  Final Clinical Impression(s) / ED Diagnoses Final diagnoses:  Dizziness    Rx / DC Orders ED Discharge Orders    None       Quintella Reichert, MD 05/08/20 2002

## 2020-05-08 NOTE — ED Notes (Signed)
Lab informed of add on D-Dimer. Tube in lab.

## 2021-03-21 DIAGNOSIS — I6389 Other cerebral infarction: Secondary | ICD-10-CM | POA: Diagnosis not present

## 2021-03-22 DIAGNOSIS — R001 Bradycardia, unspecified: Secondary | ICD-10-CM | POA: Diagnosis not present

## 2021-05-13 IMAGING — CR DG CHEST 2V
2 series · 2 of 2 positions shown · non-contrast
Comparison: February 23, 2020

CLINICAL DATA: Lightheaded with blurred vision.

EXAM:
CHEST - 2 VIEW

[w chest pa]
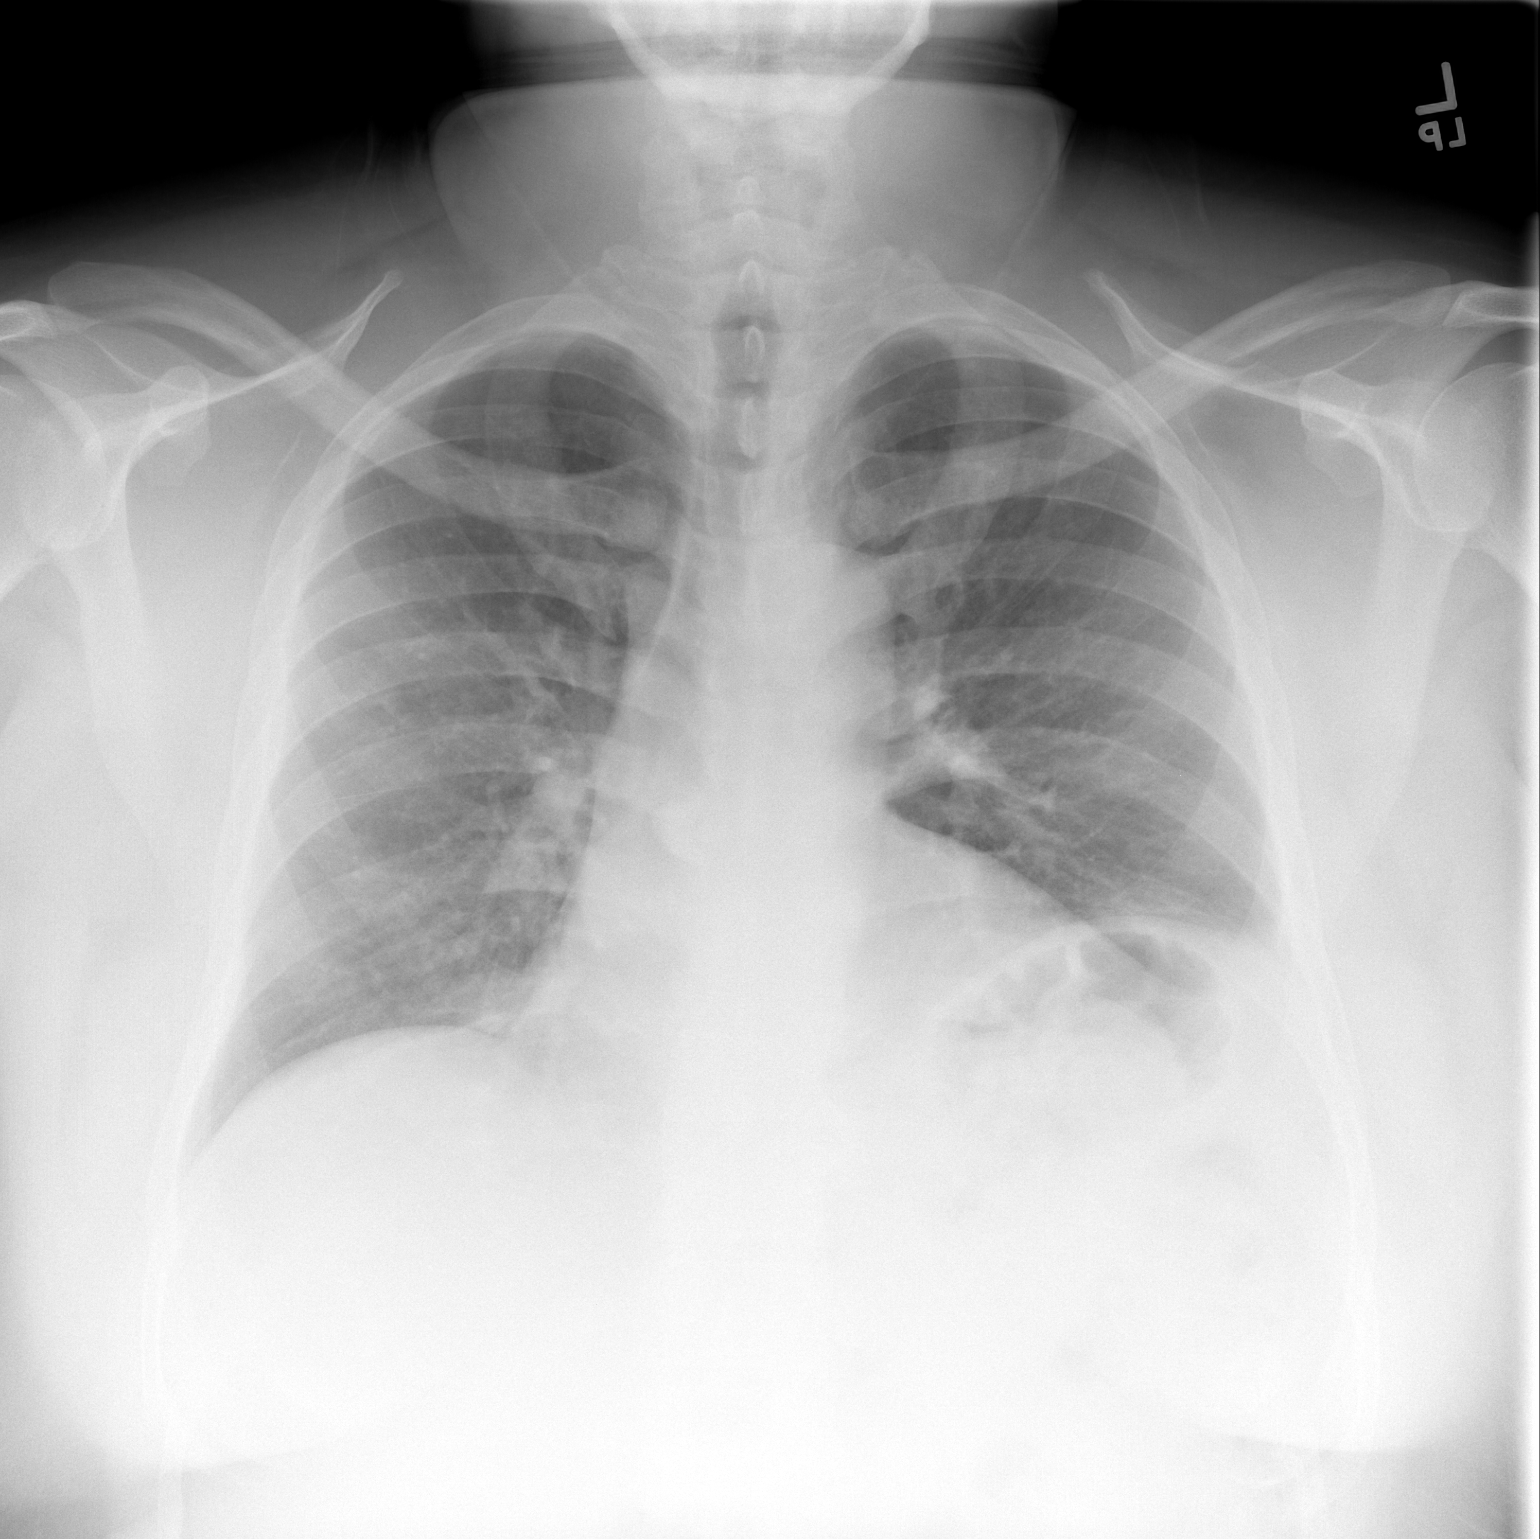

[w chest lat]
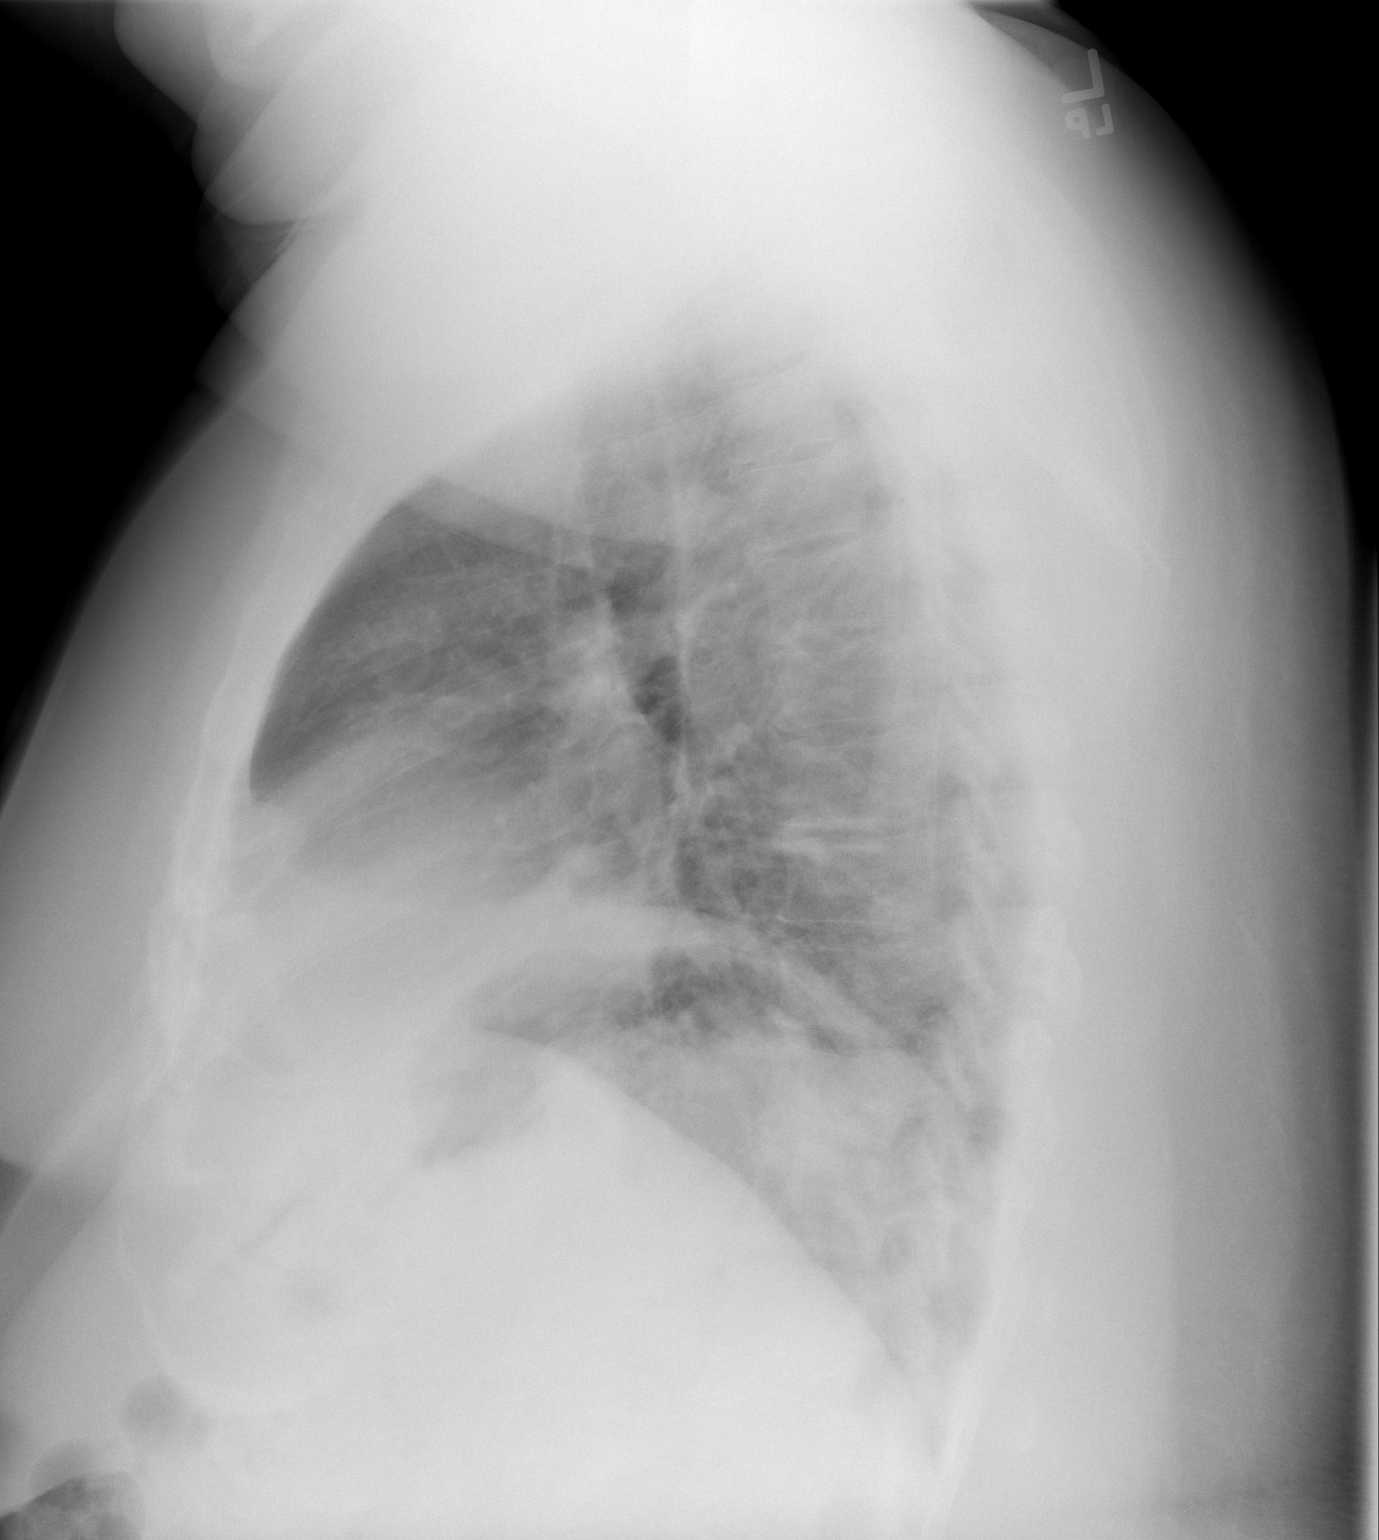

[2 of 2 positions shown; findings below may reference images not displayed]

FINDINGS: There is no evidence of acute infiltrate, pleural effusion or
pneumothorax. There is mild elevation of the left hemidiaphragm. The
heart size and mediastinal contours are within normal limits.
Degenerative changes seen within the mid to lower thoracic spine.
IMPRESSION: No active cardiopulmonary disease.

## 2023-04-21 ENCOUNTER — Other Ambulatory Visit: Payer: Self-pay

## 2023-04-21 ENCOUNTER — Emergency Department (HOSPITAL_BASED_OUTPATIENT_CLINIC_OR_DEPARTMENT_OTHER)
Admission: EM | Admit: 2023-04-21 | Discharge: 2023-04-21 | Disposition: A | Discharge status: Home / Self Care | Payer: Medicaid Other | Attending: Emergency Medicine | Admitting: Emergency Medicine

## 2023-04-21 ENCOUNTER — Emergency Department (HOSPITAL_BASED_OUTPATIENT_CLINIC_OR_DEPARTMENT_OTHER): Payer: Medicaid Other

## 2023-04-21 ENCOUNTER — Encounter (HOSPITAL_BASED_OUTPATIENT_CLINIC_OR_DEPARTMENT_OTHER): Payer: Self-pay | Admitting: Emergency Medicine

## 2023-04-21 DIAGNOSIS — F17210 Nicotine dependence, cigarettes, uncomplicated: Secondary | ICD-10-CM | POA: Diagnosis not present

## 2023-04-21 DIAGNOSIS — E119 Type 2 diabetes mellitus without complications: Secondary | ICD-10-CM | POA: Insufficient documentation

## 2023-04-21 DIAGNOSIS — N132 Hydronephrosis with renal and ureteral calculous obstruction: Secondary | ICD-10-CM | POA: Insufficient documentation

## 2023-04-21 DIAGNOSIS — Z7982 Long term (current) use of aspirin: Secondary | ICD-10-CM | POA: Insufficient documentation

## 2023-04-21 DIAGNOSIS — Z7984 Long term (current) use of oral hypoglycemic drugs: Secondary | ICD-10-CM | POA: Insufficient documentation

## 2023-04-21 DIAGNOSIS — R1031 Right lower quadrant pain: Secondary | ICD-10-CM | POA: Diagnosis present

## 2023-04-21 DIAGNOSIS — I10 Essential (primary) hypertension: Secondary | ICD-10-CM | POA: Insufficient documentation

## 2023-04-21 DIAGNOSIS — Z79899 Other long term (current) drug therapy: Secondary | ICD-10-CM | POA: Diagnosis not present

## 2023-04-21 DIAGNOSIS — N200 Calculus of kidney: Secondary | ICD-10-CM

## 2023-04-21 HISTORY — DX: Type 2 diabetes mellitus without complications: E11.9

## 2023-04-21 LAB — URINALYSIS, ROUTINE W REFLEX MICROSCOPIC
Bilirubin Urine: NEGATIVE
Glucose, UA: 100 mg/dL — AB
Hgb urine dipstick: NEGATIVE
Ketones, ur: NEGATIVE mg/dL
Leukocytes,Ua: NEGATIVE
Nitrite: NEGATIVE
Protein, ur: NEGATIVE mg/dL
Specific Gravity, Urine: >=1.03 (ref 1.005–1.030)
pH: 5.5 (ref 5.0–8.0)

## 2023-04-21 LAB — CBC WITH DIFFERENTIAL/PLATELET
Abs Immature Granulocytes: 0.03 10*3/uL (ref 0.00–0.07)
Basophils Absolute: 0.1 10*3/uL (ref 0.0–0.1)
Basophils Relative: 1 %
Eosinophils Absolute: 0.4 10*3/uL (ref 0.0–0.5)
Eosinophils Relative: 4 %
HCT: 43.2 % (ref 39.0–52.0)
Hemoglobin: 14 g/dL (ref 13.0–17.0)
Immature Granulocytes: 0 %
Lymphocytes Relative: 53 %
Lymphs Abs: 5.2 10*3/uL — ABNORMAL HIGH (ref 0.7–4.0)
MCH: 27.5 pg (ref 26.0–34.0)
MCHC: 32.4 g/dL (ref 30.0–36.0)
MCV: 84.9 fL (ref 80.0–100.0)
Monocytes Absolute: 0.9 10*3/uL (ref 0.1–1.0)
Monocytes Relative: 9 %
Neutro Abs: 3.2 10*3/uL (ref 1.7–7.7)
Neutrophils Relative %: 33 %
Platelets: 273 10*3/uL (ref 150–400)
RBC: 5.09 MIL/uL (ref 4.22–5.81)
RDW: 14.3 % (ref 11.5–15.5)
WBC: 9.9 10*3/uL (ref 4.0–10.5)
nRBC: 0 % (ref 0.0–0.2)

## 2023-04-21 LAB — BASIC METABOLIC PANEL
Anion gap: 7 (ref 5–15)
BUN: 12 mg/dL (ref 6–20)
CO2: 25 mmol/L (ref 22–32)
Calcium: 8.7 mg/dL — ABNORMAL LOW (ref 8.9–10.3)
Chloride: 104 mmol/L (ref 98–111)
Creatinine, Ser: 1.1 mg/dL (ref 0.61–1.24)
GFR, Estimated: >60 mL/min (ref >60)
Glucose, Bld: 206 mg/dL — ABNORMAL HIGH (ref 70–99)
Potassium: 4.1 mmol/L (ref 3.5–5.1)
Sodium: 136 mmol/L (ref 135–145)

## 2023-04-21 MED ORDER — KETOROLAC TROMETHAMINE 15 MG/ML IJ SOLN
15.0000 mg | Freq: Once | INTRAMUSCULAR | Status: AC
  Administered 2023-04-21: 15 mg via INTRAVENOUS
  Filled 2023-04-21: qty 1

## 2023-04-21 MED ORDER — HYDROMORPHONE HCL 1 MG/ML IJ SOLN
1.0000 mg | Freq: Once | INTRAMUSCULAR | Status: AC
  Administered 2023-04-21: 1 mg via INTRAVENOUS
  Filled 2023-04-21: qty 1

## 2023-04-21 MED ORDER — ONDANSETRON HCL 4 MG/2ML IJ SOLN
4.0000 mg | Freq: Once | INTRAMUSCULAR | Status: AC
  Administered 2023-04-21: 4 mg via INTRAVENOUS
  Filled 2023-04-21: qty 2

## 2023-04-21 NOTE — Discharge Instructions (Addendum)
Thank you for coming to [facility]. You were seen for flank pain. We did an exam, labs, and imaging, and these showed a stone in your bladder that you likely passed from your kidney. This will pass in your urine on its own. You can alternate taking Tylenol and ibuprofen as needed for pain. You can take 650mg  tylenol (acetaminophen) every 4-6 hours, and 600 mg ibuprofen 3 times a day.   Please follow up with your primary care provider within 1 week.   Do not hesitate to return to the ED or call 911 if you experience: -Worsening symptoms -Lightheadedness, passing out -Fevers/chills -Anything else that concerns you

## 2023-04-21 NOTE — ED Notes (Signed)
Patient transported to CT 

## 2023-04-21 NOTE — ED Triage Notes (Signed)
Pt woke up about  0430 with right side flank pain. Denies injury or Hx of.

## 2023-04-21 NOTE — ED Notes (Signed)
Pt provided strainer. Pain improved. Pt in lobby waiting for friend to take home. Pain improved. Ambulatory at discharge

## 2023-04-21 NOTE — ED Provider Notes (Signed)
[facility] Provider Note: Lowella Dell, MD, FACEP  CSN: [id number] MRN: [id number] ARRIVAL: 04/21/23 at 0621 ROOM: [facility]06/[facility]06   CHIEF COMPLAINT  Flank Pain   HISTORY OF PRESENT ILLNESS  04/21/23 6:30 AM Dean Hunt is a 47 y.o. male who awakened at 0430 this morning with right flank pain radiating to the right lower quadrant of his abdomen.  He rates the pain as a 10 out of 10.  The pain is not significantly worse with movement or palpation but he does feel like it is worse when he lies on his right side.  He has had associated nausea but no vomiting.  He feels like he needs to move his bowels but could not.  He has been able to urinate and has not noticed blood in his urine.   Past Medical History:  Diagnosis Date   Anxiety    Depression    DM (diabetes mellitus) (HCC)    History of methamphetamine use    Hypertension    Obsessive-compulsive disorder     Past Surgical History:  Procedure Laterality Date   TONSILLECTOMY     WISDOM TOOTH EXTRACTION      History reviewed. No pertinent family history.  Social History   Tobacco Use   Smoking status: Every Day    Packs/day: .5    Types: Cigarettes   Smokeless tobacco: Never  Vaping Use   Vaping Use: Never used  Substance Use Topics   Alcohol use: Yes    Comment: occ   Drug use: Not Currently    Prior to Admission medications   Medication Sig Start Date End Date Taking? Authorizing Provider  ALPRAZolam (XANAX XR) 2 MG 24 hr tablet Take 2 mg by mouth at bedtime.     [provider]  ALPRAZolam (XANAX XR) 2 MG 24 hr tablet Take by mouth. 03/31/17   [provider]  ALPRAZolam Prudy Feeler) 1 MG tablet Take 1 mg by mouth at bedtime as needed for anxiety.    [provider]  ALPRAZolam Prudy Feeler) 1 MG tablet Take 1/2 to 1 tab 2 times a day 03/31/17   [provider]  amLODipine (NORVASC) 5 MG tablet Take 5 mg by mouth daily.    [provider]  aspirin EC 81 MG tablet Take  81 mg by mouth daily.    [provider]  ciprofloxacin (CIPRO) 500 MG tablet Take 1 tablet (500 mg total) by mouth 2 (two) times daily. 12/12/18   Joy, Shawn C, PA-C  dicyclomine (BENTYL) 20 MG tablet Take 1 tablet (20 mg total) by mouth 2 (two) times daily. 12/12/18   Joy, Shawn C, PA-C  famotidine (PEPCID) 20 MG tablet Take 1 tablet (20 mg total) by mouth 2 (two) times daily. 04/15/18   Law, Waylan Boga, PA-C  glipiZIDE (GLUCOTROL) 5 MG tablet Take by mouth daily before breakfast.    [provider]  hydrochlorothiazide (HYDRODIURIL) 25 MG tablet Take 1 tablet (25 mg total) by mouth daily for 30 days. 04/03/19 05/03/19  Long, Arlyss Repress, MD  hydrocortisone (ANUSOL-HC) 2.5 % rectal cream Apply rectally 2 times daily 04/15/18   Law, Waylan Boga, PA-C  lidocaine (LIDODERM) 5 % Place 1 patch onto the skin daily. Remove & Discard patch within 12 hours or as directed by MD 12/12/18   Harolyn Rutherford C, PA-C  loperamide (IMODIUM) 2 MG capsule Take 1 capsule (2 mg total) by mouth 4 (four) times daily as needed for diarrhea or loose stools. 10/19/19  Law, Alexandra M, PA-C  losartan (COZAAR) 25 MG tablet Take by mouth. 12/10/19   [provider]  meclizine (ANTIVERT) 25 MG tablet Take 1 tablet (25 mg total) by mouth 3 (three) times daily as needed for dizziness. 07/02/18   Melene Plan, DO  methocarbamol (ROBAXIN) 500 MG tablet Take 1 tablet (500 mg total) by mouth 2 (two) times daily. 12/12/18   Joy, Shawn C, PA-C  METOPROLOL SUCCINATE ER PO Take 25 mg by mouth 2 (two) times daily.     [provider]  metroNIDAZOLE (FLAGYL) 500 MG tablet Take 1 tablet (500 mg total) by mouth 2 (two) times daily. 12/12/18   Joy, Shawn C, PA-C  omeprazole (PRILOSEC) 20 MG capsule Take 20 mg by mouth daily.    [provider]  ondansetron (ZOFRAN) 4 MG tablet Take 1 tablet (4 mg total) by mouth every 6 (six) hours. 04/15/18   Law, Waylan Boga, PA-C  ondansetron (ZOFRAN-ODT) 4 MG disintegrating  tablet Take 1 tablet (4 mg total) by mouth every 8 (eight) hours as needed for nausea or vomiting. 01/13/19   Benjiman Core, MD  Vitamin D, Ergocalciferol, (DRISDOL) 50000 units CAPS capsule Take 50,000 Units by mouth every 7 (seven) days.    [provider]    Allergies Compazine [prochlorperazine edisylate] and Penicillins   REVIEW OF SYSTEMS  Negative except as noted here or in the History of Present Illness.   PHYSICAL EXAMINATION  Initial Vital Signs Blood pressure (S) (!) 197/121, pulse 67, temperature 98.5 F (36.9 C), temperature source Oral, resp. rate 18, height 5\' 11"  (1.803 m), weight 113.4 kg, SpO2 100 %.  Examination General: Well-developed, well-nourished male in no acute distress; appearance consistent with age of record HENT: normocephalic; atraumatic Eyes: Normal appearance Neck: supple Heart: regular rate and rhythm Lungs: clear to auscultation bilaterally Abdomen: soft; nondistended; nontender; bowel sounds present GU: No CVA tenderness Extremities: No deformity; full range of motion; pulses normal Neurologic: Awake, alert and oriented; motor function intact in all extremities and symmetric; no facial droop Skin: Warm and dry Psychiatric: Grimacing   RESULTS  Summary of this visit's results, reviewed and interpreted by myself:   EKG Interpretation  Date/Time:    Ventricular Rate:    PR Interval:    QRS Duration:   QT Interval:    QTC Calculation:   R Axis:     Text Interpretation:         Laboratory Studies: Results for orders placed or performed during the hospital encounter of 04/21/23 (from the past 24 hour(s))  CBC with Differential     Status: Abnormal   Collection Time: 04/21/23  6:42 AM  Result Value Ref Range   WBC 9.9 4.0 - 10.5 K/uL   RBC 5.09 4.22 - 5.81 MIL/uL   Hemoglobin 14.0 13.0 - 17.0 g/dL   HCT 16.1 09.6 - 04.5 %   MCV 84.9 80.0 - 100.0 fL   MCH 27.5 26.0 - 34.0 pg   MCHC 32.4 30.0 - 36.0 g/dL   RDW 40.9  81.1 - 91.4 %   Platelets 273 150 - 400 K/uL   nRBC 0.0 0.0 - 0.2 %   Neutrophils Relative % 33 %   Neutro Abs 3.2 1.7 - 7.7 K/uL   Lymphocytes Relative 53 %   Lymphs Abs 5.2 (H) 0.7 - 4.0 K/uL   Monocytes Relative 9 %   Monocytes Absolute 0.9 0.1 - 1.0 K/uL   Eosinophils Relative 4 %   Eosinophils Absolute 0.4  0.0 - 0.5 K/uL   Basophils Relative 1 %   Basophils Absolute 0.1 0.0 - 0.1 K/uL   Immature Granulocytes 0 %   Abs Immature Granulocytes 0.03 0.00 - 0.07 K/uL  Basic metabolic panel     Status: Abnormal   Collection Time: 04/21/23  6:42 AM  Result Value Ref Range   Sodium 136 135 - 145 mmol/L   Potassium 4.1 3.5 - 5.1 mmol/L   Chloride 104 98 - 111 mmol/L   CO2 25 22 - 32 mmol/L   Glucose, Bld 206 (H) 70 - 99 mg/dL   BUN 12 6 - 20 mg/dL   Creatinine, Ser 1.61 0.61 - 1.24 mg/dL   Calcium 8.7 (L) 8.9 - 10.3 mg/dL   GFR, Estimated >09 >60 mL/min   Anion gap 7 5 - 15  Urinalysis, Routine w reflex microscopic -Urine, Clean Catch     Status: Abnormal   Collection Time: 04/21/23  7:10 AM  Result Value Ref Range   Color, Urine YELLOW YELLOW   APPearance CLEAR CLEAR   Specific Gravity, Urine >=1.030 1.005 - 1.030   pH 5.5 5.0 - 8.0   Glucose, UA 100 (A) NEGATIVE mg/dL   Hgb urine dipstick NEGATIVE NEGATIVE   Bilirubin Urine NEGATIVE NEGATIVE   Ketones, ur NEGATIVE NEGATIVE mg/dL   Protein, ur NEGATIVE NEGATIVE mg/dL   Nitrite NEGATIVE NEGATIVE   Leukocytes,Ua NEGATIVE NEGATIVE   Imaging Studies: CT Renal Stone Study  Result Date: 04/21/2023 CLINICAL DATA:  47 year old male with history of abdominal and flank pain. Suspected kidney stone. EXAM: CT ABDOMEN AND PELVIS WITHOUT CONTRAST TECHNIQUE: Multidetector CT imaging of the abdomen and pelvis was performed following the standard protocol without IV contrast. RADIATION DOSE REDUCTION: This exam was performed according to the departmental dose-optimization program which includes automated exposure control, adjustment of the  mA and/or kV according to patient size and/or use of iterative reconstruction technique. COMPARISON:  CT of the abdomen and pelvis 12/12/2018. FINDINGS: Lower chest: Unremarkable. Hepatobiliary: Diffuse low attenuation throughout the hepatic parenchyma, indicative of a background of hepatic steatosis. No definite suspicious cystic or solid hepatic lesions are confidently identified on today's noncontrast CT examination. Unenhanced appearance of the gallbladder is unremarkable. Pancreas: No definite pancreatic mass or peripancreatic fluid collections or inflammatory changes are noted on today's noncontrast CT examination. Spleen: Unremarkable. Adrenals/Urinary Tract: In the posterior aspect of the urinary bladder there is a 4 mm calculus lying dependently. No additional calculi are noted within the collecting system of either kidney or along the course of either ureter. However, there is some mild right-sided hydroureteronephrosis, which may suggest recent passage of the stone from the right ureter with residual right ureteral edema. No left hydroureteronephrosis. Unenhanced appearance of the kidneys, bilateral adrenal glands and urinary bladder is otherwise unremarkable. Stomach/Bowel: Unenhanced appearance of the stomach is normal. No pathologic dilatation of small bowel or colon. Numerous colonic diverticuli are noted, particularly in the sigmoid colon, without surrounding inflammatory changes to indicate an acute diverticulitis at this time. Normal appendix. Vascular/Lymphatic: No atherosclerotic calcifications are noted in the abdominal aorta or pelvic vasculature. No lymphadenopathy noted in the abdomen or pelvis. Reproductive: Prostate gland and seminal vesicles are unremarkable in appearance. Other: No significant volume of ascites.  No pneumoperitoneum. Musculoskeletal: There are no aggressive appearing lytic or blastic lesions noted in the visualized portions of the skeleton. IMPRESSION: 1. 4 mm calculus  lying dependently in the urinary bladder. No ureteral stones. However, there is mild residual right hydroureteronephrosis, suggesting recent  passage of this calculus from the right side. 2. Hepatic steatosis. 3. Colonic diverticulosis without evidence of acute diverticulitis at this time. Electronically Signed   By: Trudie Reed M.D.   On: 04/21/2023 07:08    ED COURSE and MDM  Nursing notes, initial and subsequent vitals signs, including pulse oximetry, reviewed and interpreted by myself.  Vitals:   04/21/23 0626 04/21/23 0733  BP: (S) (!) 197/121 (!) 156/89  Pulse: 67 78  Resp: 18 18  Temp: 98.5 F (36.9 C) 98.5 F (36.9 C)  TempSrc: Oral   SpO2: 100% 98%  Weight: 113.4 kg   Height: 5\' 11"  (1.803 m)    Medications  ondansetron (ZOFRAN) injection 4 mg (4 mg Intravenous Given 04/21/23 0643)  HYDROmorphone (DILAUDID) injection 1 mg (1 mg Intravenous Given 04/21/23 0642)  ketorolac (TORADOL) 15 MG/ML injection 15 mg (15 mg Intravenous Given 04/21/23 0718)   6:46 AM I suspect ureterolithiasis given his clinical presentation.  CT scan and laboratory studies pending.  7:00 AM CT shows what appears to be a passed stone in the bladder. Urinalysis pending. Signed out to Dr. Jearld Fenton.   PROCEDURES  Procedures   ED DIAGNOSES     ICD-10-CM   1. Kidney stone  N20.0          Rodnisha Blomgren, Jonny Ruiz, MD 04/21/23 2239

## 2023-04-21 NOTE — ED Provider Notes (Signed)
7:10 AM Assumed care of patient from off-going team. For more details, please see note from same day.  In brief, this is a 47 y.o. male who p/w flank pain.  Plan/Dispo at time of sign-out & ED Course since sign-out: [ ]  CT read [ ]  UA  BP (S) (!) 197/121 (BP Location: Right Arm) Comment: did not takeBP meds this morning.  Pulse 67   Temp 98.5 F (36.9 C) (Oral)   Resp 18   Ht 5\' 11"  (1.803 m)   Wt 113.4 kg   SpO2 100%   BMI 34.87 kg/m    ED Course:   Clinical Course as of 04/21/23 0751  Fri Apr 21, 2023  0718 CT Renal Stone Study 1. 4 mm calculus lying dependently in the urinary bladder. No ureteral stones. However, there is mild residual right hydroureteronephrosis, suggesting recent passage of this calculus from the right side. 2. Hepatic steatosis. 3. Colonic diverticulosis without evidence of acute diverticulitis at this time.   [HN]  [id number] Creatinine: 1.10 BL 0.9-1.0 [HN]  0718 WBC: 9.9 No leukocytosis  [HN]  0722 Urinalysis, Routine w reflex microscopic -Urine, Clean Catch(!) No UTI [HN]    Clinical Course User Index [HN] Loetta Rough, MD    Dispo: Patient feels much improved after pain medicine.  I discussed with him his results and he is relieved.  Initial blood pressure in the 190s over 120s however that was in the midst of severe pain and repeat blood pressure in the 150s over 80s.  Patient is instructed to take Tylenol ibuprofen at home and informed that he will pass the stone from his bladder through his urethra.  Instructed to follow-up with PCP as needed and stay well-hydrated at home.  DC with discharge instructions and return precautions, all questions answered to patient satisfaction. ------------------------------- Vivi Barrack, MD Emergency Medicine  This note was created using dictation software, which may contain spelling or grammatical errors.   Loetta Rough, MD 04/21/23 [phone]

## 2024-01-09 ENCOUNTER — Encounter (HOSPITAL_BASED_OUTPATIENT_CLINIC_OR_DEPARTMENT_OTHER): Payer: Self-pay

## 2024-01-09 ENCOUNTER — Emergency Department (HOSPITAL_BASED_OUTPATIENT_CLINIC_OR_DEPARTMENT_OTHER)
Admission: EM | Admit: 2024-01-09 | Discharge: 2024-01-09 | Disposition: A | Discharge status: Home / Self Care | Payer: MEDICAID | Attending: Emergency Medicine | Admitting: Emergency Medicine

## 2024-01-09 ENCOUNTER — Other Ambulatory Visit: Payer: Self-pay

## 2024-01-09 DIAGNOSIS — R0981 Nasal congestion: Secondary | ICD-10-CM | POA: Diagnosis present

## 2024-01-09 DIAGNOSIS — R197 Diarrhea, unspecified: Secondary | ICD-10-CM | POA: Insufficient documentation

## 2024-01-09 DIAGNOSIS — J069 Acute upper respiratory infection, unspecified: Secondary | ICD-10-CM | POA: Insufficient documentation

## 2024-01-09 DIAGNOSIS — Z7982 Long term (current) use of aspirin: Secondary | ICD-10-CM | POA: Diagnosis not present

## 2024-01-09 LAB — RESP PANEL BY RT-PCR (RSV, FLU A&B, COVID)  RVPGX2
Influenza A by PCR: NEGATIVE
Influenza B by PCR: NEGATIVE
Resp Syncytial Virus by PCR: NEGATIVE
SARS Coronavirus 2 by RT PCR: NEGATIVE

## 2024-01-09 LAB — CBG MONITORING, ED: Glucose-Capillary: 137 mg/dL — ABNORMAL HIGH (ref 70–99)

## 2024-01-09 MED ORDER — ONDANSETRON 4 MG PO TBDP: ORAL_TABLET | ORAL | 0 refills | Status: AC

## 2024-01-09 NOTE — ED Triage Notes (Signed)
 Pt reports diarrhea,fever, and chills that began Friday. nasal drainage/ congestion since Sunday. Vision feels blurry

## 2024-01-09 NOTE — Discharge Instructions (Signed)
 Take tylenol 2 pills 4 times a day and motrin 4 pills 3 times a day.  Drink plenty of fluids.  Return for worsening shortness of breath, headache, confusion. Follow up with your family doctor.   You can take Imodium for diarrhea.  I have given you information to follow-up with the infectious disease clinic if you would like to see them in the office.

## 2024-01-09 NOTE — ED Provider Notes (Signed)
 [facility] Provider Note   CSN: [id number] Arrival date & time: 01/09/24  1726     History  Chief Complaint  Patient presents with   Diarrhea   Nasal Congestion    Dean Hunt is a 48 y.o. male.  48 yo M with a chief complaints of cough congestion and diarrhea.  This been going on for couple days.  Recently was visited by his boyfriend who has similar symptoms.  His boyfriend was tested yesterday and was told he was negative for COVID flu and RSV.  Denies any difficulty breathing.  Mild abdominal discomfort.   Diarrhea      Home Medications Prior to Admission medications   Medication Sig Start Date End Date Taking? Authorizing Provider  ondansetron (ZOFRAN-ODT) 4 MG disintegrating tablet 4mg  ODT q4 hours prn nausea/vomit 01/09/24  Yes Melene Plan, DO  ALPRAZolam (XANAX XR) 2 MG 24 hr tablet Take 2 mg by mouth at bedtime.     [provider]  ALPRAZolam (XANAX XR) 2 MG 24 hr tablet Take by mouth. 03/31/17   [provider]  ALPRAZolam Prudy Feeler) 1 MG tablet Take 1 mg by mouth at bedtime as needed for anxiety.    [provider]  ALPRAZolam Prudy Feeler) 1 MG tablet Take 1/2 to 1 tab 2 times a day 03/31/17   [provider]  amLODipine (NORVASC) 5 MG tablet Take 5 mg by mouth daily.    [provider]  aspirin EC 81 MG tablet Take 81 mg by mouth daily.    [provider]  ciprofloxacin (CIPRO) 500 MG tablet Take 1 tablet (500 mg total) by mouth 2 (two) times daily. 12/12/18   Joy, Shawn C, PA-C  dicyclomine (BENTYL) 20 MG tablet Take 1 tablet (20 mg total) by mouth 2 (two) times daily. 12/12/18   Joy, Shawn C, PA-C  famotidine (PEPCID) 20 MG tablet Take 1 tablet (20 mg total) by mouth 2 (two) times daily. 04/15/18   Law, Waylan Boga, PA-C  glipiZIDE (GLUCOTROL) 5 MG tablet Take by mouth daily before breakfast.    [provider]  hydrochlorothiazide (HYDRODIURIL) 25 MG tablet Take 1  tablet (25 mg total) by mouth daily for 30 days. 04/03/19 05/03/19  Long, Arlyss Repress, MD  hydrocortisone (ANUSOL-HC) 2.5 % rectal cream Apply rectally 2 times daily 04/15/18   Law, Waylan Boga, PA-C  lidocaine (LIDODERM) 5 % Place 1 patch onto the skin daily. Remove & Discard patch within 12 hours or as directed by MD 12/12/18   Harolyn Rutherford C, PA-C  loperamide (IMODIUM) 2 MG capsule Take 1 capsule (2 mg total) by mouth 4 (four) times daily as needed for diarrhea or loose stools. 10/19/19   Law, Waylan Boga, PA-C  losartan (COZAAR) 25 MG tablet Take by mouth. 12/10/19   [provider]  meclizine (ANTIVERT) 25 MG tablet Take 1 tablet (25 mg total) by mouth 3 (three) times daily as needed for dizziness. 07/02/18   Melene Plan, DO  methocarbamol (ROBAXIN) 500 MG tablet Take 1 tablet (500 mg total) by mouth 2 (two) times daily. 12/12/18   Joy, Shawn C, PA-C  METOPROLOL SUCCINATE ER PO Take 25 mg by mouth 2 (two) times daily.     [provider]  metroNIDAZOLE (FLAGYL) 500 MG tablet Take 1 tablet (500 mg total) by mouth 2 (two) times daily. 12/12/18   Joy, Shawn C, PA-C  omeprazole (PRILOSEC) 20 MG capsule Take 20 mg by mouth daily.  [provider]  ondansetron (ZOFRAN) 4 MG tablet Take 1 tablet (4 mg total) by mouth every 6 (six) hours. 04/15/18   Law, Waylan Boga, PA-C  Vitamin D, Ergocalciferol, (DRISDOL) 50000 units CAPS capsule Take 50,000 Units by mouth every 7 (seven) days.    [provider]      Allergies    Compazine [prochlorperazine edisylate] and Penicillins    Review of Systems   Review of Systems  Gastrointestinal:  Positive for diarrhea.    Physical Exam Updated Vital Signs BP (!) 136/94 (BP Location: Right Arm)   Pulse 99   Temp 98.4 F (36.9 C) (Oral)   Resp 18   Wt 115.7 kg   SpO2 99%   BMI 35.57 kg/m  Physical Exam Vitals and nursing note reviewed.  Constitutional:      Appearance: He is well-developed.  HENT:     Head: Normocephalic and  atraumatic.     Comments: Swollen turbinates, posterior nasal drip, no noted sinus ttp, tm normal bilaterally.   Eyes:     Pupils: Pupils are equal, round, and reactive to light.  Neck:     Vascular: No JVD.  Cardiovascular:     Rate and Rhythm: Normal rate and regular rhythm.     Heart sounds: No murmur heard.    No friction rub. No gallop.  Pulmonary:     Effort: No respiratory distress.     Breath sounds: No wheezing.  Abdominal:     General: There is no distension.     Tenderness: There is no abdominal tenderness. There is no guarding or rebound.  Musculoskeletal:        General: Normal range of motion.     Cervical back: Normal range of motion and neck supple.  Skin:    Coloration: Skin is not pale.     Findings: No rash.  Neurological:     Mental Status: He is alert and oriented to person, place, and time.  Psychiatric:        Behavior: Behavior normal.     ED Results / Procedures / Treatments   Labs (all labs ordered are listed, but only abnormal results are displayed) Labs Reviewed  CBG MONITORING, ED - Abnormal; Notable for the following components:      Result Value   Glucose-Capillary 137 (*)    All other components within normal limits  RESP PANEL BY RT-PCR (RSV, FLU A&B, COVID)  [id number]  RPR  HIV ANTIBODY (ROUTINE TESTING W REFLEX)    EKG None  Radiology No results found.  Procedures Procedures    Medications Ordered in ED Medications - No data to display  ED Course/ Medical Decision Making/ A&P                                 Medical Decision Making Amount and/or Complexity of Data Reviewed Labs: ordered.  Risk Prescription drug management.   48 yo M with a chief complaints of cough congestion and diarrhea.  This been going on for couple days.  He is well-appearing and nontoxic.  He is a male who has sex with other men.  No obvious red flag symptoms.  Will send off screening HIV.  Given information to follow-up with ID clinic if he so  chooses.  9:37 PM:  I have discussed the diagnosis/risks/treatment options with the patient.  Evaluation and diagnostic testing in the emergency department does not suggest an emergent  condition requiring admission or immediate intervention beyond what has been performed at this time.  They will follow up with PCP, ID. We also discussed returning to the ED immediately if new or worsening sx occur. We discussed the sx which are most concerning (e.g., sudden worsening pain, fever, inability to tolerate by mouth) that necessitate immediate return. Medications administered to the patient during their visit and any new prescriptions provided to the patient are listed below.  Medications given during this visit Medications - No data to display   The patient appears reasonably screen and/or stabilized for discharge and I doubt any other medical condition or other [facility] requiring further screening, evaluation, or treatment in the ED at this time prior to discharge.          Final Clinical Impression(s) / ED Diagnoses Final diagnoses:  Diarrhea, unspecified type  Upper respiratory tract infection, unspecified type    Rx / DC Orders ED Discharge Orders          Ordered    ondansetron (ZOFRAN-ODT) 4 MG disintegrating tablet        01/09/24 2128              Melene Plan, DO 01/09/24 2137

## 2024-01-10 LAB — RPR: RPR Ser Ql: NONREACTIVE

## 2024-01-10 LAB — HIV ANTIBODY (ROUTINE TESTING W REFLEX): HIV Screen 4th Generation wRfx: NONREACTIVE
